# Patient Record
Sex: Female | Born: 1990 | Race: White | Hispanic: No | Marital: Single | Smoking: Current some day smoker
Health system: Southern US, Community
[De-identification: ages and names within clinical notes are randomized; demographics above are authoritative.]

## PROBLEM LIST (undated history)

## (undated) DIAGNOSIS — B029 Zoster without complications: Secondary | ICD-10-CM

## (undated) DIAGNOSIS — Z8619 Personal history of other infectious and parasitic diseases: Secondary | ICD-10-CM

## (undated) DIAGNOSIS — S0990XA Unspecified injury of head, initial encounter: Secondary | ICD-10-CM

## (undated) HISTORY — DX: Zoster without complications: B02.9

## (undated) HISTORY — DX: Personal history of other infectious and parasitic diseases: Z86.19

## (undated) HISTORY — PX: ADENOIDECTOMY: SUR15

## (undated) HISTORY — DX: Unspecified injury of head, initial encounter: S09.90XA

## (undated) HISTORY — PX: MYRINGOTOMY: SUR874

## (undated) HISTORY — PX: WISDOM TOOTH EXTRACTION: SHX21

---
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
## 2012-01-15 ENCOUNTER — Encounter: Payer: Self-pay | Admitting: Internal Medicine

## 2012-01-15 ENCOUNTER — Ambulatory Visit (INDEPENDENT_AMBULATORY_CARE_PROVIDER_SITE_OTHER): Payer: BC Managed Care – PPO | Admitting: Internal Medicine

## 2012-01-15 VITALS — BP 102/72 | HR 79 | Temp 98.3°F | Ht 63.5 in | Wt 124.0 lb

## 2012-01-15 DIAGNOSIS — Z8709 Personal history of other diseases of the respiratory system: Secondary | ICD-10-CM | POA: Insufficient documentation

## 2012-01-15 DIAGNOSIS — J351 Hypertrophy of tonsils: Secondary | ICD-10-CM

## 2012-01-15 DIAGNOSIS — G4710 Hypersomnia, unspecified: Secondary | ICD-10-CM

## 2012-01-15 DIAGNOSIS — Z0000 Encounter for general adult medical examination without abnormal findings: Secondary | ICD-10-CM

## 2012-01-15 DIAGNOSIS — N946 Dysmenorrhea, unspecified: Secondary | ICD-10-CM

## 2012-01-15 DIAGNOSIS — F410 Panic disorder [episodic paroxysmal anxiety] without agoraphobia: Secondary | ICD-10-CM

## 2012-01-15 DIAGNOSIS — N920 Excessive and frequent menstruation with regular cycle: Secondary | ICD-10-CM

## 2012-01-15 LAB — CBC WITH DIFFERENTIAL/PLATELET
Basophils Absolute: 0 10*3/uL (ref 0.0–0.1)
Basophils Relative: 0.4 % (ref 0.0–3.0)
Eosinophils Absolute: 0.1 10*3/uL (ref 0.0–0.7)
HCT: 39.2 % (ref 36.0–46.0)
Hemoglobin: 13.2 g/dL (ref 12.0–15.0)
MCHC: 33.8 g/dL (ref 30.0–36.0)
MCV: 93.1 fl (ref 78.0–100.0)
Monocytes Absolute: 0.4 10*3/uL (ref 0.1–1.0)
Neutro Abs: 2.4 10*3/uL (ref 1.4–7.7)
Platelets: 166 10*3/uL (ref 150.0–400.0)

## 2012-01-15 LAB — LIPID PANEL
HDL: 72 mg/dL (ref >39.00)
LDL Cholesterol: 50 mg/dL (ref 0–99)

## 2012-01-15 LAB — HEPATIC FUNCTION PANEL
ALT: 14 U/L (ref 0–35)
Bilirubin, Direct: 0 mg/dL (ref 0.0–0.3)
Total Bilirubin: 0.7 mg/dL (ref 0.3–1.2)

## 2012-01-15 LAB — BASIC METABOLIC PANEL
CO2: 25 mEq/L (ref 19–32)
Calcium: 9.2 mg/dL (ref 8.4–10.5)
Chloride: 105 mEq/L (ref 96–112)

## 2012-01-15 LAB — T4, FREE: Free T4: 0.76 ng/dL (ref 0.60–1.60)

## 2012-01-15 LAB — TSH: TSH: 0.88 u[IU]/mL (ref 0.35–5.50)

## 2012-01-15 NOTE — Progress Notes (Signed)
Subjective:    Patient ID: Kathleen Hayes, female    DOB: 1990/11/25, 21 y.o.   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 Sees Dr Marlyne Beards about every 6 months. Recently on OCPS for cramps and dysmenorrhea  Recent formulation for 2 months.  To move in sorority house next week. Has had 3 episodes of throat infection seen at [facility] this year and got better . No labs done . Sleeping a lot  Since home from school but working 20 - 30 hours  Summer school  Bedtime 11 arise 6- 7 depending.   Review of Systems ROS:  GEN/ HEENT: No fever, significant weight changes sweats headaches vision problems hearing changes, CV/ PULM; No chest pain shortness of breath cough, syncope,edema  change in exercise tolerance. GI /GU: No adominal pain, vomiting, change in bowel habits. No blood in the stool. SKIN/HEME: ,no acute skin rashes suspicious lesions or bleeding. No lymphadenopathy, nodules, masses.  NEURO/ PSYCH:  No neurologic signs such as weakness numbness. No depression; anxiety.seems to be controlled  IMM/ Allergy: No unusual infections.   REST of 12 system review negative except as per HPI  Outpatient Encounter Prescriptions as of 01/15/2012  Medication Sig Dispense Refill  . ALPRAZolam (XANAX) 0.5 MG tablet 0.5 mg.       . cetirizine (ZYRTEC) 10 MG tablet Take 10 mg by mouth daily.      . citalopram (CELEXA) 20 MG tablet Take 20 mg by mouth daily.        . norgestimate-ethinyl estradiol (ORTHO-CYCLEN,SPRINTEC,PREVIFEM) 0.25-35 MG-MCG tablet Take 1 tablet by mouth daily.  1 Package  11  . DISCONTD: clindamycin-benzoyl peroxide (BENZACLIN WITH PUMP) gel       . DISCONTD: Mefenamic Acid 250 MG CAPS Take 1 capsule (250 mg total) by mouth 4 (four) times daily as needed.  28 each  11       Objective:   Physical Exam BP 102/72  Pulse 79   Temp 98.3 F (36.8 C) (Oral)  Ht 5' 3.5" (1.613 m)  Wt 124 lb (56.246 kg)  BMI 21.62 kg/m2 Wt Readings from Last 3 Encounters:  01/15/12 124 lb (56.246 kg) (43.48%*)  09/11/11 126 lb (57.153 kg) (49.02%*)  01/14/11 109 lb (49.442 kg) (16.96%*)   * Growth percentiles are based on CDC 2-20 Years data.   Ht Readings from Last 3 Encounters:  01/15/12 5' 3.5" (1.613 m) (37.91%*)  09/11/11 5' 3.5" (1.613 m) (38.10%*)  01/14/11 5\' 4"  (1.626 m) (46.40%*)   * Growth percentiles are based on CDC 2-20 Years data.   Body mass index is 21.62 kg/(m^2). @BMIFA @ 43.48%ile based on CDC 2-20 Years weight-for-age data. 37.91%ile based on CDC 2-20 Years stature-for-age data.  Physical Exam: Vital signs reviewed GNF:AOZH is a well-developed well-nourished alert cooperative  white female who appears her stated age in no acute distress.  HEENT: normocephalic atraumatic , Eyes: PERRL EOM's full, conjunctiva clear, Nares: paten,t no deformity discharge or tenderness., Ears: no deformity EAC's clear TMs with normal landmarks. Mouth: OP, no lesions, edema tonsil left 2+ right 1 + no exudate  .  Moist mucous membranes. Dentition in adequate repair. NECK: supple without masses, thyromegaly or bruits. CHEST/PULM:  Clear to auscultation and percussion breath sounds equal no wheeze , rales or rhonchi. No chest wall deformities or tenderness. Breast: normal by  inspection . No dimpling, discharge, masses, tenderness or discharge . CV: PMI is nondisplaced, S1 S2 no gallops, murmurs, rubs. Peripheral pulses are full without delay.No JVD .  ABDOMEN: Bowel sounds normal nontender  No guard or rebound, no hepato splenomegal no CVA tenderness.  No hernia. Extremtities:  No clubbing cyanosis or edema, no acute joint swelling or redness no focal atrophy NEURO:  Oriented x3, cranial nerves 3-12 appear to be intact, no obvious focal weakness,gait within normal limits no abnormal reflexes or asymmetrical SKIN: No acute  rashes normal turgor, color, no bruising or petechiae. PSYCH: Oriented, good eye contact, no obvious depression anxiety, cognition and judgment appear normal. LN: no cervical axillary inguinal adenopathy     Assessment & Plan:  Preventive Health Care Counseled regarding healthy nutrition, exercise, sleep, injury prevention, calcium vit d and healthy weight . utd on immuniz  By hx  ( not in ehr)  Doing better good weight  Reviewed with pt.  Anxiety  Stable on meds per psych Dysmenorrhea  under rx ocps. Per dr Lily Peer  Fatigue: excess sleepiness   Poss deprivation r/o mono after hx of tonsil infection this semester but exam looks good and should improve.  Counseled. Fu if alarm features or  persistent or progressive looks much more relaxed and healthy today.

## 2012-01-15 NOTE — Patient Instructions (Signed)
Your exam is normal except for somewhat enlarged tonsils. Continue lifestyle intervention healthy eating and exercise . Sleep deficit and irreg schedule can add to sleepiness. Weight is just right.  Will notify you  of labs when available. Checking for anemia and poss that you had mono and are recovering.

## 2012-01-16 LAB — EPSTEIN-BARR VIRUS VCA ANTIBODY PANEL
EBV EA IgG: <5 U/mL (ref <9.0)
EBV VCA IgM: <10 U/mL (ref <36.0)

## 2012-11-23 ENCOUNTER — Telehealth: Payer: Self-pay | Admitting: Internal Medicine

## 2012-11-23 NOTE — Telephone Encounter (Signed)
Pt mother called and stated that the daughter needs a CPX prior to 01/29/13. Please assist me in finding a slot.

## 2012-11-23 NOTE — Telephone Encounter (Signed)
See if wp has any Tues or Wed appt available.  Ok to combine 2 appointments.

## 2012-11-26 NOTE — Telephone Encounter (Signed)
lmovm /ga °

## 2012-11-30 ENCOUNTER — Other Ambulatory Visit: Payer: Self-pay | Admitting: Gynecology

## 2012-11-30 ENCOUNTER — Encounter: Payer: BC Managed Care – PPO | Admitting: Internal Medicine

## 2012-12-07 ENCOUNTER — Encounter: Payer: Self-pay | Admitting: Gynecology

## 2012-12-07 ENCOUNTER — Ambulatory Visit (INDEPENDENT_AMBULATORY_CARE_PROVIDER_SITE_OTHER): Payer: BC Managed Care – PPO | Admitting: Gynecology

## 2012-12-07 VITALS — BP 120/74 | Ht 63.5 in | Wt 129.0 lb

## 2012-12-07 DIAGNOSIS — Z113 Encounter for screening for infections with a predominantly sexual mode of transmission: Secondary | ICD-10-CM

## 2012-12-07 DIAGNOSIS — Z01419 Encounter for gynecological examination (general) (routine) without abnormal findings: Secondary | ICD-10-CM

## 2012-12-07 MED ORDER — NORGESTIMATE-ETH ESTRADIOL 0.25-35 MG-MCG PO TABS: 1.0000 | ORAL_TABLET | Freq: Every day | ORAL | Status: DC

## 2012-12-07 NOTE — Progress Notes (Signed)
Kathleen Hayes May 05, 1991 [id number]   History:    22 y.o.  for annual gyn exam with no complaints today. The patient is on Sprintec 28 day oral contraceptive pills for cycle control and for acne. Patient is tolerated well and is having normal menstrual cycle. Patient is currently not sexually active but was active in February of this year.  Past medical history,surgical history, family history and social history were all reviewed and documented in the EPIC chart.  Gynecologic History Patient's last menstrual period was 11/26/2012. Contraception: OCP (estrogen/progesterone) Last Pap: none indicated. Results were: none indicated Last mammogram: none indicated. Results were: none indicated  Obstetric History OB History   Grav Para Term Preterm Abortions TAB SAB Ect Mult Living   0                ROS: A ROS was performed and pertinent positives and negatives are included in the history.  GENERAL: No fevers or chills. HEENT: No change in vision, no earache, sore throat or sinus congestion. NECK: No pain or stiffness. CARDIOVASCULAR: No chest pain or pressure. No palpitations. PULMONARY: No shortness of breath, cough or wheeze. GASTROINTESTINAL: No abdominal pain, nausea, vomiting or diarrhea, melena or bright red blood per rectum. GENITOURINARY: No urinary frequency, urgency, hesitancy or dysuria. MUSCULOSKELETAL: No joint or muscle pain, no back pain, no recent trauma. DERMATOLOGIC: No rash, no itching, no lesions. ENDOCRINE: No polyuria, polydipsia, no heat or cold intolerance. No recent change in weight. HEMATOLOGICAL: No anemia or easy bruising or bleeding. NEUROLOGIC: No headache, seizures, numbness, tingling or weakness. PSYCHIATRIC: No depression, no loss of interest in normal activity or change in sleep pattern.     Exam: chaperone present  BP 120/74  Ht 5' 3.5" (1.613 m)  Wt 129 lb (58.514 kg)  BMI 22.49 kg/m2  LMP 11/26/2012  Body mass index is 22.49 kg/(m^2).  General  appearance : Well developed well nourished female. No acute distress HEENT: Neck supple, trachea midline, no carotid bruits, no thyroidmegaly Lungs: Clear to auscultation, no rhonchi or wheezes, or rib retractions  Heart: Regular rate and rhythm, no murmurs or gallops Breast:Examined in sitting and supine position were symmetrical in appearance, no palpable masses or tenderness,  no skin retraction, no nipple inversion, no nipple discharge, no skin discoloration, no axillary or supraclavicular lymphadenopathy Abdomen: no palpable masses or tenderness, no rebound or guarding Extremities: no edema or skin discoloration or tenderness  Pelvic:  Bartholin, Urethra, Skene Glands: Within normal limits             Vagina: No gross lesions or discharge  Cervix: No gross lesions or discharge  Uterus  anteverted, normal size, shape and consistency, non-tender and mobile  Adnexa  Without masses or tenderness  Anus and perineum  normal   Rectovaginal  normal sphincter tone without palpated masses or tenderness             Hemoccult that indicated   Gardasil Vaccine previously completed  Assessment/Plan:  22 y.o. female for annual exam with no complaints in normal evaluation today. Patient was given prescription refill for Sprintec 28 day oral contraceptive pill. No Pap smear done today the new guidelines discuss. Her first Pap smear will be next year at age 64. GC and chlamydia culture was obtained today. Patient was reminded to do her monthly breast exam.    Ok Edwards MD, 3:38 PM 12/07/2012

## 2012-12-09 ENCOUNTER — Other Ambulatory Visit: Payer: Self-pay | Admitting: Gynecology

## 2012-12-09 MED ORDER — AZITHROMYCIN 500 MG PO TABS: 1000.0000 mg | ORAL_TABLET | Freq: Once | ORAL | Status: DC

## 2012-12-28 ENCOUNTER — Ambulatory Visit (INDEPENDENT_AMBULATORY_CARE_PROVIDER_SITE_OTHER): Payer: BC Managed Care – PPO | Admitting: Gynecology

## 2012-12-28 ENCOUNTER — Encounter: Payer: Self-pay | Admitting: Gynecology

## 2012-12-28 DIAGNOSIS — Z8619 Personal history of other infectious and parasitic diseases: Secondary | ICD-10-CM

## 2012-12-28 DIAGNOSIS — Z113 Encounter for screening for infections with a predominantly sexual mode of transmission: Secondary | ICD-10-CM

## 2012-12-28 HISTORY — DX: Personal history of other infectious and parasitic diseases: Z86.19

## 2012-12-28 NOTE — Progress Notes (Signed)
Patient presented to the office today for a test of cure. She was seen in the office on June 24 her annual exam in the [facility] and chlamydia culture been obtained with a positive result for Chlamydia noted. Patient was prescribed Azithromax 1 g by mouth which she took. She was instructed to notify her partners that he could be treated accordingly. Patient is asymptomatic today.  Wet prep was obtained today which was negative. GC and Chlamydia culture was obtained resulting from this dictation. To complete the full STD screen patient will stop by the lab to be tested for HIV, RPR, hepatitis B and C.

## 2012-12-29 LAB — RPR

## 2012-12-29 LAB — HIV ANTIBODY (ROUTINE TESTING W REFLEX): HIV: NONREACTIVE

## 2013-01-04 ENCOUNTER — Encounter: Payer: Self-pay | Admitting: Internal Medicine

## 2013-01-04 ENCOUNTER — Ambulatory Visit (INDEPENDENT_AMBULATORY_CARE_PROVIDER_SITE_OTHER): Payer: BC Managed Care – PPO | Admitting: Internal Medicine

## 2013-01-04 VITALS — BP 120/72 | HR 97 | Temp 99.0°F | Wt 127.0 lb

## 2013-01-04 DIAGNOSIS — J329 Chronic sinusitis, unspecified: Secondary | ICD-10-CM

## 2013-01-04 DIAGNOSIS — J309 Allergic rhinitis, unspecified: Secondary | ICD-10-CM

## 2013-01-04 MED ORDER — AMOXICILLIN-POT CLAVULANATE 875-125 MG PO TABS: 1.0000 | ORAL_TABLET | Freq: Two times a day (BID) | ORAL | Status: DC

## 2013-01-04 NOTE — Progress Notes (Signed)
Chief Complaint  Patient presents with  . Nasal Congestion    Started on Friday.  Thick white/clear mucus.  Has tried OTC Mucinex and Dayquil  . Cough  . Otalgia  . Sinus pain/pressure  . Headache  . Night Sweats    HPI: Patient comes in today for SDA for  new problem evaluation. Onset about 4-5 days ago like a  Head cold  And now face pain not be relieved .  Zyrtec.   No fever some sweats.  Persistent facial pain and left ear pain. No hxp of signifcant allergies. But has had Flonase in the past.  Home for the day.     From [place] where she lives and goes to school and works. Indications updated no major changes in her health since last seen. ROS: See pertinent positives and negatives per HPI.  Past Medical History  Diagnosis Date  . Acne   . Concussion   . Shingles   . Anxiety     and pa nic; [facility] consult 2011  . History of chlamydia 12/28/2012    Family History  Problem Relation Age of Onset  . Mitral valve prolapse Father   . Hypertension Father   . Allergies Other   . Hypertension Other   . Cancer Maternal Grandfather     PANCREATIC  . Cancer Paternal Grandfather     BRAIN TUMOR  . Hypertension Paternal Grandfather     History   Social History  . Marital Status: Single    Spouse Name: N/A    Number of Children: N/A  . Years of Education: N/A   Social History Main Topics  . Smoking status: Never Smoker   . Smokeless tobacco: Never Used  . Alcohol Use: No  . Drug Use: No  . Sexually Active: No   Other Topics Concern  . 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     Outpatient Encounter Prescriptions as of 01/04/2013  Medication Sig Dispense Refill  . ALPRAZolam (XANAX) 0.5 MG tablet 0.5 mg.       . cetirizine (ZYRTEC) 10 MG tablet  Take 10 mg by mouth daily.      . citalopram (CELEXA) 20 MG tablet Take 20 mg by mouth daily.        . norgestimate-ethinyl estradiol (SPRINTEC 28) 0.25-35 MG-MCG tablet Take 1 tablet by mouth daily.  1 Package  11  . amoxicillin-clavulanate (AUGMENTIN) 875-125 MG per tablet Take 1 tablet by mouth every 12 (twelve) hours. For sinusitis  20 tablet  0  . [DISCONTINUED] azithromycin (ZITHROMAX) 500 MG tablet Take 2 tablets (1,000 mg total) by mouth once.  2 tablet  0   No facility-administered encounter medications on file as of 01/04/2013.    EXAM:  BP 120/72  Pulse 97  Temp(Src) 99 F (37.2 C) (Oral)  Wt 127 lb (57.607 kg)  BMI 22.14 kg/m2  SpO2 98%  LMP 12/13/2012  Body mass index is 22.14 kg/(m^2).   WDWN in NAD  quiet respirations; moderately congested  somewhat hoarse. Non toxic . HEENT: Normocephalic ;atraumatic , Eyes;  PERRL, EOMs  Full, lids and conjunctiva clear,,Ears: no deformities, canals nl, TM landmarks normal,  on the right left slightly distorted but gray clear fluid. Nose: no deformity mucoid dischargecongested;face maxillary area tender Mouth : OP clear without lesion or edema . Tonsils +1 mild erythema no exudate Neck: Supple without adenopathy or masses or bruits Chest:  Clear to A&P without wheezes rales or rhonchi CV:  S1-S2 no gallops or murmurs peripheral perfusion is normal Skin :nl perfusion and no acute rashes   PSYCH: pleasant and cooperative,   ASSESSMENT AND PLAN:  Discussed the following assessment and plan:  Sinusitis - Expectant management although not 10 days severity of symptoms lack of response to decongestants and face pain we'll add antibiotic  ALLERGIC RHINITIS CAUSE UNSPECIFIED - Potential contributor could try over-the-counter nasal cortisone additionally Risk-benefit of antibiotics discussed -Patient advised to return or notify health care team  if symptoms worsen or persist or new concerns arise.  Patient Instructions  Most sinuitis  resolves with decongestants and time  Up to 10 - 14 days however because of severiry of your symptoms it is possible that antibiotic will help resolve sx earlier.    Antibiotic decongestants and can try otc nasal steroid  ( id nasacort ) if you have allergy.  Sinusitis Sinusitis is redness, soreness, and swelling (inflammation) of the paranasal sinuses. Paranasal sinuses are air pockets within the bones of your face (beneath the eyes, the middle of the forehead, or above the eyes). In healthy paranasal sinuses, mucus is able to drain out, and air is able to circulate through them by way of your nose. However, when your paranasal sinuses are inflamed, mucus and air can become trapped. This can allow bacteria and other germs to grow and cause infection. Sinusitis can develop quickly and last only a short time (acute) or continue over a long period (chronic). Sinusitis that lasts for more than 12 weeks is considered chronic.  CAUSES  Causes of sinusitis include:  Allergies.  Structural abnormalities, such as displacement of the cartilage that separates your nostrils (deviated septum), which can decrease the air flow through your nose and sinuses and affect sinus drainage.  Functional abnormalities, such as when the small hairs (cilia) that line your sinuses and help remove mucus do not work properly or are not present. SYMPTOMS  Symptoms of acute and chronic sinusitis are the same. The primary symptoms are pain and pressure around the affected sinuses. Other symptoms include:  Upper toothache.  Earache.  Headache.  Bad breath.  Decreased sense of smell and taste.  A cough, which worsens when you are lying flat.  Fatigue.  Fever.  Thick drainage from your nose, which often is green and may contain pus (purulent).  Swelling and warmth over the affected sinuses. DIAGNOSIS  Your caregiver will perform a physical exam. During the exam, your caregiver may:  Look in your nose for signs  of abnormal growths in your nostrils (nasal polyps).  Tap over the affected sinus to check for signs of infection.  View the inside of your sinuses (endoscopy) with a special imaging device with a light attached (endoscope), which is inserted into your sinuses. If your caregiver suspects that you have chronic sinusitis, one or more of the following tests may be recommended:  Allergy tests.  Nasal culture A sample of mucus is taken from your nose and sent to a lab and screened for bacteria.  Nasal cytology A sample of mucus is taken from your nose and examined by your caregiver to determine if your sinusitis is related to an allergy. TREATMENT  Most  cases of acute sinusitis are related to a viral infection and will resolve on their own within 10 days. Sometimes medicines are prescribed to help relieve symptoms (pain medicine, decongestants, nasal steroid sprays, or saline sprays).  However, for sinusitis related to a bacterial infection, your caregiver will prescribe antibiotic medicines. These are medicines that will help kill the bacteria causing the infection.  Rarely, sinusitis is caused by a fungal infection. In theses cases, your caregiver will prescribe antifungal medicine. For some cases of chronic sinusitis, surgery is needed. Generally, these are cases in which sinusitis recurs more than 3 times per year, despite other treatments. HOME CARE INSTRUCTIONS   Drink plenty of water. Water helps thin the mucus so your sinuses can drain more easily.  Use a humidifier.  Inhale steam 3 to 4 times a day (for example, sit in the bathroom with the shower running).  Apply a warm, moist washcloth to your face 3 to 4 times a day, or as directed by your caregiver.  Use saline nasal sprays to help moisten and clean your sinuses.  Take over-the-counter or prescription medicines for pain, discomfort, or fever only as directed by your caregiver. SEEK IMMEDIATE MEDICAL CARE IF:  You have  increasing pain or severe headaches.  You have nausea, vomiting, or drowsiness.  You have swelling around your face.  You have vision problems.  You have a stiff neck.  You have difficulty breathing. MAKE SURE YOU:   Understand these instructions.  Will watch your condition.  Will get help right away if you are not doing well or get worse. Document Released: 06/02/2005 Document Revised: 08/25/2011 Document Reviewed: 06/17/2011 [facility] Patient Information 2014 [place], [place].     Neta Mends. Dagny Fiorentino M.D.

## 2013-01-04 NOTE — Patient Instructions (Signed)
Most sinuitis resolves with decongestants and time  Up to 10 - 14 days however because of severiry of your symptoms it is possible that antibiotic will help resolve sx earlier.    Antibiotic decongestants and can try otc nasal steroid  ( id nasacort ) if you have allergy.  Sinusitis Sinusitis is redness, soreness, and swelling (inflammation) of the paranasal sinuses. Paranasal sinuses are air pockets within the bones of your face (beneath the eyes, the middle of the forehead, or above the eyes). In healthy paranasal sinuses, mucus is able to drain out, and air is able to circulate through them by way of your nose. However, when your paranasal sinuses are inflamed, mucus and air can become trapped. This can allow bacteria and other germs to grow and cause infection. Sinusitis can develop quickly and last only a short time (acute) or continue over a long period (chronic). Sinusitis that lasts for more than 12 weeks is considered chronic.  CAUSES  Causes of sinusitis include:  Allergies.  Structural abnormalities, such as displacement of the cartilage that separates your nostrils (deviated septum), which can decrease the air flow through your nose and sinuses and affect sinus drainage.  Functional abnormalities, such as when the small hairs (cilia) that line your sinuses and help remove mucus do not work properly or are not present. SYMPTOMS  Symptoms of acute and chronic sinusitis are the same. The primary symptoms are pain and pressure around the affected sinuses. Other symptoms include:  Upper toothache.  Earache.  Headache.  Bad breath.  Decreased sense of smell and taste.  A cough, which worsens when you are lying flat.  Fatigue.  Fever.  Thick drainage from your nose, which often is green and may contain pus (purulent).  Swelling and warmth over the affected sinuses. DIAGNOSIS  Your caregiver will perform a physical exam. During the exam, your caregiver may:  Look in your  nose for signs of abnormal growths in your nostrils (nasal polyps).  Tap over the affected sinus to check for signs of infection.  View the inside of your sinuses (endoscopy) with a special imaging device with a light attached (endoscope), which is inserted into your sinuses. If your caregiver suspects that you have chronic sinusitis, one or more of the following tests may be recommended:  Allergy tests.  Nasal culture A sample of mucus is taken from your nose and sent to a lab and screened for bacteria.  Nasal cytology A sample of mucus is taken from your nose and examined by your caregiver to determine if your sinusitis is related to an allergy. TREATMENT  Most cases of acute sinusitis are related to a viral infection and will resolve on their own within 10 days. Sometimes medicines are prescribed to help relieve symptoms (pain medicine, decongestants, nasal steroid sprays, or saline sprays).  However, for sinusitis related to a bacterial infection, your caregiver will prescribe antibiotic medicines. These are medicines that will help kill the bacteria causing the infection.  Rarely, sinusitis is caused by a fungal infection. In theses cases, your caregiver will prescribe antifungal medicine. For some cases of chronic sinusitis, surgery is needed. Generally, these are cases in which sinusitis recurs more than 3 times per year, despite other treatments. HOME CARE INSTRUCTIONS   Drink plenty of water. Water helps thin the mucus so your sinuses can drain more easily.  Use a humidifier.  Inhale steam 3 to 4 times a day (for example, sit in the bathroom with the shower running).  Apply a warm, moist washcloth to your face 3 to 4 times a day, or as directed by your caregiver.  Use saline nasal sprays to help moisten and clean your sinuses.  Take over-the-counter or prescription medicines for pain, discomfort, or fever only as directed by your caregiver. SEEK IMMEDIATE MEDICAL CARE  IF:  You have increasing pain or severe headaches.  You have nausea, vomiting, or drowsiness.  You have swelling around your face.  You have vision problems.  You have a stiff neck.  You have difficulty breathing. MAKE SURE YOU:   Understand these instructions.  Will watch your condition.  Will get help right away if you are not doing well or get worse. Document Released: 06/02/2005 Document Revised: 08/25/2011 Document Reviewed: 06/17/2011 [facility] Patient Information 2014 [address] [place], [place].

## 2013-01-18 ENCOUNTER — Ambulatory Visit (INDEPENDENT_AMBULATORY_CARE_PROVIDER_SITE_OTHER): Payer: BC Managed Care – PPO | Admitting: Internal Medicine

## 2013-01-18 ENCOUNTER — Encounter: Payer: Self-pay | Admitting: Internal Medicine

## 2013-01-18 ENCOUNTER — Encounter: Payer: BC Managed Care – PPO | Admitting: Internal Medicine

## 2013-01-18 VITALS — BP 96/64 | HR 70 | Temp 98.1°F | Ht 64.0 in | Wt 127.0 lb

## 2013-01-18 DIAGNOSIS — J309 Allergic rhinitis, unspecified: Secondary | ICD-10-CM

## 2013-01-18 DIAGNOSIS — Z0000 Encounter for general adult medical examination without abnormal findings: Secondary | ICD-10-CM

## 2013-01-18 LAB — POCT HEMOGLOBIN: Hemoglobin: 13.5 g/dL (ref 12.2–16.2)

## 2013-01-18 NOTE — Progress Notes (Signed)
Chief Complaint  Patient presents with  . Annual Exam    HPI: Patient comes in today for Preventive Health Care visit    Sinus infection is better still some congestion and ears clogged but no pain .  Periods regular .   On these meds .   Counseling Dr Gerrie Nordmann  Monthly . Or every 2 months.  Mood stable  Junior   Rising.  House   With 5 people .  [facility]  recnet gyne check  rx chl and neg blood work  Reviewed  ROS:  GEN/ HEENT: No fever, significant weight changes sweats headaches vision problems hearing changes, CV/ PULM; No chest pain shortness of breath cough, syncope,edema  change in exercise tolerance. GI /GU: No adominal pain, vomiting, change in bowel habits. No blood in the stool. No significant GU symptoms. SKIN/HEME: ,no acute skin rashes suspicious lesions or bleeding. No lymphadenopathy, nodules, masses.  NEURO/ PSYCH:  No neurologic signs such as weakness numbness. No depression anxiety. IMM/ Allergy: No unusual infections.  Allergy .   REST of 12 system review negative except as per HPI   Past Medical History  Diagnosis Date  . Acne   . Concussion   . Shingles   . Anxiety     and pa nic; [facility] consult 2011  . History of chlamydia 12/28/2012  . HEAD TRAUMA, CLOSED 06/08/2007    Qualifier: Diagnosis of  By: Fabian Sharp MD, Neta Mends     Family History  Problem Relation Age of Onset  . Mitral valve prolapse Father   . Hypertension Father   . Allergies Other   . Hypertension Other   . Cancer Maternal Grandfather     PANCREATIC  . Cancer Paternal Grandfather     BRAIN TUMOR  . Hypertension Paternal Grandfather     History   Social History  . Marital Status: Single    Spouse Name: N/A    Number of Children: N/A  . Years of Education: N/A   Social History Main Topics  . Smoking status: Never Smoker   . Smokeless tobacco: Never Used  . Alcohol Use: No  . Drug Use: No  . Sexually Active: No   Other Topics Concern  . 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  House  4-5 roommates     Outpatient Encounter Prescriptions as of 01/18/2013  Medication Sig Dispense Refill  . ALPRAZolam (XANAX) 0.5 MG tablet 0.5 mg.       . cetirizine (ZYRTEC) 10 MG tablet Take 10 mg by mouth daily.      . citalopram (CELEXA) 20 MG tablet Take 20 mg by mouth daily.        . norgestimate-ethinyl estradiol (SPRINTEC 28) 0.25-35 MG-MCG tablet Take 1 tablet by mouth daily.  1 Package  11  . [DISCONTINUED] amoxicillin-clavulanate (AUGMENTIN) 875-125 MG per tablet Take 1 tablet by mouth every 12 (twelve) hours. For sinusitis  20 tablet  0   No facility-administered encounter medications on file as of 01/18/2013.    EXAM:  BP 96/64  Pulse 70  Temp(Src) 98.1 F (36.7 C) (Oral)  Ht 5\' 4"  (1.626 m)  Wt 127 lb (57.607  kg)  BMI 21.79 kg/m2  SpO2 98%  LMP 01/16/2013  Body mass index is 21.79 kg/(m^2).  Physical Exam: Vital signs reviewed EAV:WUJW is a well-developed well-nourished alert cooperative   female who appears her stated age in no acute distress.  HEENT: normocephalic atraumatic , Eyes: PERRL EOM's full, conjunctiva clear, Nares: paten,t no deformity discharge or tenderness.,mild congestion Ears: no deformity EAC's clear TMs with normal landmarks. Mouth: clear OP, no lesions, edema.  Moist mucous membranes. Dentition in adequate repair. NECK: supple without masses, thyromegaly or bruits. CHEST/PULM:  Clear to auscultation and percussion breath sounds equal no wheeze , rales or rhonchi. No chest wall deformities or tenderness. Breast: normal by inspection . No dimpling, discharge, masses, tenderness or discharge . CV: PMI is nondisplaced, S1 S2 no gallops, murmurs, rubs. Peripheral pulses are full without delay.No JVD .  ABDOMEN: Bowel sounds normal  nontender  No guard or rebound, no hepato splenomegal no CVA tenderness.  No hernia. Extremtities:  No clubbing cyanosis or edema, no acute joint swelling or redness no focal atrophy NEURO:  Oriented x3, cranial nerves 3-12 appear to be intact, no obvious focal weakness,gait within normal limits no abnormal reflexes or asymmetrical SKIN: No acute rashes normal turgor, color, no bruising or petechiae. PSYCH: Oriented, good eye contact, no obvious depression anxiety, cognition and judgment appear normal. LN: no cervical axillary inguinal adenopathy Screening ortho / MS exam: normal;  No scoliosis ,LOM , joint swelling or gait disturbance . Muscle mass is normal .    Lab Results  Component Value Date   WBC 4.6 01/15/2012   HGB 13.5 01/18/2013   HCT 39.2 01/15/2012   PLT 166.0 01/15/2012   GLUCOSE 88 01/15/2012   CHOL 136 01/15/2012   TRIG 72.0 01/15/2012   HDL 72.00 01/15/2012   LDLCALC 50 01/15/2012   ALT 14 01/15/2012   AST 18 01/15/2012   NA 138 01/15/2012   K 4.0 01/15/2012   CL 105 01/15/2012   CREATININE 0.5 01/15/2012   BUN 12 01/15/2012   CO2 25 01/15/2012   TSH 0.88 01/15/2012    ASSESSMENT AND PLAN:  Discussed the following assessment and plan:  Visit for preventive health examination - Plan: POCT hemoglobin  ALLERGIC RHINITIS CAUSE UNSPECIFIED utd on immunizations due tdap next year  Counseled regarding healthy nutrition, exercise, sleep, injury prevention, calcium vit d and healthy weight .  Patient Care Team: Madelin Headings, MD as PCP - General Chauncey Mann, MD (Psychiatry) Wonda Amis, MD (Dermatology) Ok Edwards, MD (Obstetrics and Gynecology) Patient Instructions   Ok to use otc Nasacort for  chronic nasal allergy congestion .  SIGN UP FOR MY CHART Preventive Care for Adults, Female A healthy lifestyle and preventive care can promote health and wellness. Preventive health guidelines for women include the following key practices.  A routine yearly physical is a good way  to check with your caregiver about your health and preventive screening. It is a chance to share any concerns and updates on your health, and to receive a thorough exam.  Visit your dentist for a routine exam and preventive care every 6 months. Brush your teeth twice a day and floss once a day. Good oral hygiene prevents tooth decay and gum disease.  The frequency of eye exams is based on your age, health, family medical history, use of contact lenses, and other factors. Follow your caregiver's recommendations for frequency of eye exams.  Eat a healthy diet. Foods like vegetables, fruits, whole  grains, low-fat dairy products, and lean protein foods contain the nutrients you need without too many calories. Decrease your intake of foods high in solid fats, added sugars, and salt. Eat the right amount of calories for you.Get information about a proper diet from your caregiver, if necessary.  Regular physical exercise is one of the most important things you can do for your health. Most adults should get at least 150 minutes of moderate-intensity exercise (any activity that increases your heart rate and causes you to sweat) each week. In addition, most adults need muscle-strengthening exercises on 2 or more days a week.  Maintain a healthy weight. The body mass index (BMI) is a screening tool to identify possible weight problems. It provides an estimate of body fat based on height and weight. Your caregiver can help determine your BMI, and can help you achieve or maintain a healthy weight.For adults 20 years and older:  A BMI below 18.5 is considered underweight.  A BMI of 18.5 to 24.9 is normal.  A BMI of 25 to 29.9 is considered overweight.  A BMI of 30 and above is considered obese.  Maintain normal blood lipids and cholesterol levels by exercising and minimizing your intake of saturated fat. Eat a balanced diet with plenty of fruit and vegetables. Blood tests for lipids and cholesterol should  begin at age 48 and be repeated every 5 years. If your lipid or cholesterol levels are high, you are over 50, or you are at high risk for heart disease, you may need your cholesterol levels checked more frequently.Ongoing high lipid and cholesterol levels should be treated with medicines if diet and exercise are not effective.  If you smoke, find out from your caregiver how to quit. If you do not use tobacco, do not start.  If you are pregnant, do not drink alcohol. If you are breastfeeding, be very cautious about drinking alcohol. If you are not pregnant and choose to drink alcohol, do not exceed 1 drink per day. One drink is considered to be 12 ounces (355 mL) of beer, 5 ounces (148 mL) of wine, or 1.5 ounces (44 mL) of liquor.  Avoid use of street drugs. Do not share needles with anyone. Ask for help if you need support or instructions about stopping the use of drugs.  High blood pressure causes heart disease and increases the risk of stroke. Your blood pressure should be checked at least every 1 to 2 years. Ongoing high blood pressure should be treated with medicines if weight loss and exercise are not effective.  If you are 53 to 20 years old, ask your caregiver if you should take aspirin to prevent strokes.  Diabetes screening involves taking a blood sample to check your fasting blood sugar level. This should be done once every 3 years, after age 5, if you are within normal weight and without risk factors for diabetes. Testing should be considered at a younger age or be carried out more frequently if you are overweight and have at least 1 risk factor for diabetes.  Breast cancer screening is essential preventive care for women. You should practice "breast self-awareness." This means understanding the normal appearance and feel of your breasts and may include breast self-examination. Any changes detected, no matter how small, should be reported to a caregiver. Women in their 27s and 30s should  have a clinical breast exam (CBE) by a caregiver as part of a regular health exam every 1 to 3 years. After age 33, women  should have a CBE every year. Starting at age 42, women should consider having a mammography (breast X-ray test) every year. Women who have a family history of breast cancer should talk to their caregiver about genetic screening. Women at a high risk of breast cancer should talk to their caregivers about having magnetic resonance imaging (MRI) and a mammography every year.  The Pap test is a screening test for cervical cancer. A Pap test can show cell changes on the cervix that might become cervical cancer if left untreated. A Pap test is a procedure in which cells are obtained and examined from the lower end of the uterus (cervix).  Women should have a Pap test starting at age 31.  Between ages 48 and 55, Pap tests should be repeated every 2 years.  Beginning at age 28, you should have a Pap test every 3 years as long as the past 3 Pap tests have been normal.  Some women have medical problems that increase the chance of getting cervical cancer. Talk to your caregiver about these problems. It is especially important to talk to your caregiver if a new problem develops soon after your last Pap test. In these cases, your caregiver may recommend more frequent screening and Pap tests.  The above recommendations are the same for women who have or have not gotten the vaccine for human papillomavirus (HPV).  If you had a hysterectomy for a problem that was not cancer or a condition that could lead to cancer, then you no longer need Pap tests. Even if you no longer need a Pap test, a regular exam is a good idea to make sure no other problems are starting.  If you are between ages 53 and 51, and you have had normal Pap tests going back 10 years, you no longer need Pap tests. Even if you no longer need a Pap test, a regular exam is a good idea to make sure no other problems are  starting.  If you have had past treatment for cervical cancer or a condition that could lead to cancer, you need Pap tests and screening for cancer for at least 20 years after your treatment.  If Pap tests have been discontinued, risk factors (such as a new sexual partner) need to be reassessed to determine if screening should be resumed.  The HPV test is an additional test that may be used for cervical cancer screening. The HPV test looks for the virus that can cause the cell changes on the cervix. The cells collected during the Pap test can be tested for HPV. The HPV test could be used to screen women aged 58 years and older, and should be used in women of any age who have unclear Pap test results. After the age of 22, women should have HPV testing at the same frequency as a Pap test.  Colorectal cancer can be detected and often prevented. Most routine colorectal cancer screening begins at the age of 29 and continues through age 60. However, your caregiver may recommend screening at an earlier age if you have risk factors for colon cancer. On a yearly basis, your caregiver may provide home test kits to check for hidden blood in the stool. Use of a small camera at the end of a tube, to directly examine the colon (sigmoidoscopy or colonoscopy), can detect the earliest forms of colorectal cancer. Talk to your caregiver about this at age 33, when routine screening begins. Direct examination of the colon should be  repeated every 5 to 10 years through age 32, unless early forms of pre-cancerous polyps or small growths are found.  Hepatitis C blood testing is recommended for all people born from [birth year] through 1965 and any individual with known risks for hepatitis C.  Practice safe sex. Use condoms and avoid high-risk sexual practices to reduce the spread of sexually transmitted infections (STIs). STIs include gonorrhea, chlamydia, syphilis, trichomonas, herpes, HPV, and human immunodeficiency virus (HIV).  Herpes, HIV, and HPV are viral illnesses that have no cure. They can result in disability, cancer, and death. Sexually active women aged 14 and younger should be checked for chlamydia. Older women with new or multiple partners should also be tested for chlamydia. Testing for other STIs is recommended if you are sexually active and at increased risk.  Osteoporosis is a disease in which the bones lose minerals and strength with aging. This can result in serious bone fractures. The risk of osteoporosis can be identified using a bone density scan. Women ages 55 and over and women at risk for fractures or osteoporosis should discuss screening with their caregivers. Ask your caregiver whether you should take a calcium supplement or vitamin D to reduce the rate of osteoporosis.  Menopause can be associated with physical symptoms and risks. Hormone replacement therapy is available to decrease symptoms and risks. You should talk to your caregiver about whether hormone replacement therapy is right for you.  Use sunscreen with sun protection factor (SPF) of 30 or more. Apply sunscreen liberally and repeatedly throughout the day. You should seek shade when your shadow is shorter than you. Protect yourself by wearing long sleeves, pants, a wide-brimmed hat, and sunglasses year round, whenever you are outdoors.  Once a month, do a whole body skin exam, using a mirror to look at the skin on your back. Notify your caregiver of new moles, moles that have irregular borders, moles that are larger than a pencil eraser, or moles that have changed in shape or color.  Stay current with required immunizations.  Influenza. You need a dose every fall (or winter). The composition of the flu vaccine changes each year, so being vaccinated once is not enough.  Pneumococcal polysaccharide. You need 1 to 2 doses if you smoke cigarettes or if you have certain chronic medical conditions. You need 1 dose at age 27 (or older) if you have  never been vaccinated.  Tetanus, diphtheria, pertussis (Tdap, Td). Get 1 dose of Tdap vaccine if you are younger than age 42, are over 7 and have contact with an infant, are a Research scientist (physical sciences), are pregnant, or simply want to be protected from whooping cough. After that, you need a Td booster dose every 10 years. Consult your caregiver if you have not had at least 3 tetanus and diphtheria-containing shots sometime in your life or have a deep or dirty wound.  HPV. You need this vaccine if you are a woman age 71 or younger. The vaccine is given in 3 doses over 6 months.  Measles, mumps, rubella (MMR). You need at least 1 dose of MMR if you were born in 1957 or later. You may also need a second dose.  Meningococcal. If you are age 70 to 36 and a first-year college student living in a residence hall, or have one of several medical conditions, you need to get vaccinated against meningococcal disease. You may also need additional booster doses.  Zoster (shingles). If you are age 59 or older, you should get this vaccine.  Varicella (chickenpox). If you have never had chickenpox or you were vaccinated but received only 1 dose, talk to your caregiver to find out if you need this vaccine.  Hepatitis A. You need this vaccine if you have a specific risk factor for hepatitis A virus infection or you simply wish to be protected from this disease. The vaccine is usually given as 2 doses, 6 to 18 months apart.  Hepatitis B. You need this vaccine if you have a specific risk factor for hepatitis B virus infection or you simply wish to be protected from this disease. The vaccine is given in 3 doses, usually over 6 months. Preventive Services / Frequency Ages 56 to 40  Blood pressure check.** / Every 1 to 2 years.  Lipid and cholesterol check.** / Every 5 years beginning at age 58.  Clinical breast exam.** / Every 3 years for women in their 43s and 30s.  Pap test.** / Every 2 years from ages 38 through 7.  Every 3 years starting at age 12 through age 26 or 79 with a history of 3 consecutive normal Pap tests.  HPV screening.** / Every 3 years from ages 89 through ages 69 to 51 with a history of 3 consecutive normal Pap tests.  Hepatitis C blood test.** / For any individual with known risks for hepatitis C.  Skin self-exam. / Monthly.  Influenza immunization.** / Every year.  Pneumococcal polysaccharide immunization.** / 1 to 2 doses if you smoke cigarettes or if you have certain chronic medical conditions.  Tetanus, diphtheria, pertussis (Tdap, Td) immunization. / A one-time dose of Tdap vaccine. After that, you need a Td booster dose every 10 years.  HPV immunization. / 3 doses over 6 months, if you are 75 and younger.  Measles, mumps, rubella (MMR) immunization. / You need at least 1 dose of MMR if you were born in 1957 or later. You may also need a second dose.  Meningococcal immunization. / 1 dose if you are age 34 to 53 and a first-year college student living in a residence hall, or have one of several medical conditions, you need to get vaccinated against meningococcal disease. You may also need additional booster doses.  Varicella immunization.** / Consult your caregiver.  Hepatitis A immunization.** / Consult your caregiver. 2 doses, 6 to 18 months apart.  Hepatitis B immunization.** / Consult your caregiver. 3 doses usually over 6 months. Ages 46 to 68  Blood pressure check.** / Every 1 to 2 years.  Lipid and cholesterol check.** / Every 5 years beginning at age 36.  Clinical breast exam.** / Every year after age 67.  Mammogram.** / Every year beginning at age 40 and continuing for as long as you are in good health. Consult with your caregiver.  Pap test.** / Every 3 years starting at age 88 through age 60 or 23 with a history of 3 consecutive normal Pap tests.  HPV screening.** / Every 3 years from ages 21 through ages 100 to 66 with a history of 3 consecutive normal Pap  tests.  Fecal occult blood test (FOBT) of stool. / Every year beginning at age 87 and continuing until age 61. You may not need to do this test if you get a colonoscopy every 10 years.  Flexible sigmoidoscopy or colonoscopy.** / Every 5 years for a flexible sigmoidoscopy or every 10 years for a colonoscopy beginning at age 11 and continuing until age 50.  Hepatitis C blood test.** / For all people born from  1943 through 1965 and any individual with known risks for hepatitis C.  Skin self-exam. / Monthly.  Influenza immunization.** / Every year.  Pneumococcal polysaccharide immunization.** / 1 to 2 doses if you smoke cigarettes or if you have certain chronic medical conditions.  Tetanus, diphtheria, pertussis (Tdap, Td) immunization.** / A one-time dose of Tdap vaccine. After that, you need a Td booster dose every 10 years.  Measles, mumps, rubella (MMR) immunization. / You need at least 1 dose of MMR if you were born in 1957 or later. You may also need a second dose.  Varicella immunization.** / Consult your caregiver.  Meningococcal immunization.** / Consult your caregiver.  Hepatitis A immunization.** / Consult your caregiver. 2 doses, 6 to 18 months apart.  Hepatitis B immunization.** / Consult your caregiver. 3 doses, usually over 6 months. Ages 63 and over  Blood pressure check.** / Every 1 to 2 years.  Lipid and cholesterol check.** / Every 5 years beginning at age 28.  Clinical breast exam.** / Every year after age 27.  Mammogram.** / Every year beginning at age 57 and continuing for as long as you are in good health. Consult with your caregiver.  Pap test.** / Every 3 years starting at age 31 through age 68 or 94 with a 3 consecutive normal Pap tests. Testing can be stopped between 65 and 70 with 3 consecutive normal Pap tests and no abnormal Pap or HPV tests in the past 10 years.  HPV screening.** / Every 3 years from ages 21 through ages 102 or 52 with a history of 3  consecutive normal Pap tests. Testing can be stopped between 65 and 70 with 3 consecutive normal Pap tests and no abnormal Pap or HPV tests in the past 10 years.  Fecal occult blood test (FOBT) of stool. / Every year beginning at age 32 and continuing until age 52. You may not need to do this test if you get a colonoscopy every 10 years.  Flexible sigmoidoscopy or colonoscopy.** / Every 5 years for a flexible sigmoidoscopy or every 10 years for a colonoscopy beginning at age 43 and continuing until age 5.  Hepatitis C blood test.** / For all people born from [birth year] through 1963 and any individual with known risks for hepatitis C.  Osteoporosis screening.** / A one-time screening for women ages 55 and over and women at risk for fractures or osteoporosis.  Skin self-exam. / Monthly.  Influenza immunization.** / Every year.  Pneumococcal polysaccharide immunization.** / 1 dose at age 41 (or older) if you have never been vaccinated.  Tetanus, diphtheria, pertussis (Tdap, Td) immunization. / A one-time dose of Tdap vaccine if you are over 65 and have contact with an infant, are a Research scientist (physical sciences), or simply want to be protected from whooping cough. After that, you need a Td booster dose every 10 years.  Varicella immunization.** / Consult your caregiver.  Meningococcal immunization.** / Consult your caregiver.  Hepatitis A immunization.** / Consult your caregiver. 2 doses, 6 to 18 months apart.  Hepatitis B immunization.** / Check with your caregiver. 3 doses, usually over 6 months. ** Family history and personal history of risk and conditions may change your caregiver's recommendations. Document Released: 07/29/2001 Document Revised: 08/25/2011 Document Reviewed: 10/28/2010 [facility] Patient Information 2014 [place], [place].      Neta Mends. Panosh M.D.

## 2013-01-18 NOTE — Patient Instructions (Addendum)
Ok to use otc Nasacort for  chronic nasal allergy congestion .  SIGN UP FOR MY CHART Preventive Care for Adults, Female A healthy lifestyle and preventive care can promote health and wellness. Preventive health guidelines for women include the following key practices.  A routine yearly physical is a good way to check with your caregiver about your health and preventive screening. It is a chance to share any concerns and updates on your health, and to receive a thorough exam.  Visit your dentist for a routine exam and preventive care every 6 months. Brush your teeth twice a day and floss once a day. Good oral hygiene prevents tooth decay and gum disease.  The frequency of eye exams is based on your age, health, family medical history, use of contact lenses, and other factors. Follow your caregiver's recommendations for frequency of eye exams.  Eat a healthy diet. Foods like vegetables, fruits, whole grains, low-fat dairy products, and lean protein foods contain the nutrients you need without too many calories. Decrease your intake of foods high in solid fats, added sugars, and salt. Eat the right amount of calories for you.Get information about a proper diet from your caregiver, if necessary.  Regular physical exercise is one of the most important things you can do for your health. Most adults should get at least 150 minutes of moderate-intensity exercise (any activity that increases your heart rate and causes you to sweat) each week. In addition, most adults need muscle-strengthening exercises on 2 or more days a week.  Maintain a healthy weight. The body mass index (BMI) is a screening tool to identify possible weight problems. It provides an estimate of body fat based on height and weight. Your caregiver can help determine your BMI, and can help you achieve or maintain a healthy weight.For adults 20 years and older:  A BMI below 18.5 is considered underweight.  A BMI of 18.5 to 24.9 is  normal.  A BMI of 25 to 29.9 is considered overweight.  A BMI of 30 and above is considered obese.  Maintain normal blood lipids and cholesterol levels by exercising and minimizing your intake of saturated fat. Eat a balanced diet with plenty of fruit and vegetables. Blood tests for lipids and cholesterol should begin at age 9 and be repeated every 5 years. If your lipid or cholesterol levels are high, you are over 50, or you are at high risk for heart disease, you may need your cholesterol levels checked more frequently.Ongoing high lipid and cholesterol levels should be treated with medicines if diet and exercise are not effective.  If you smoke, find out from your caregiver how to quit. If you do not use tobacco, do not start.  If you are pregnant, do not drink alcohol. If you are breastfeeding, be very cautious about drinking alcohol. If you are not pregnant and choose to drink alcohol, do not exceed 1 drink per day. One drink is considered to be 12 ounces (355 mL) of beer, 5 ounces (148 mL) of wine, or 1.5 ounces (44 mL) of liquor.  Avoid use of street drugs. Do not share needles with anyone. Ask for help if you need support or instructions about stopping the use of drugs.  High blood pressure causes heart disease and increases the risk of stroke. Your blood pressure should be checked at least every 1 to 2 years. Ongoing high blood pressure should be treated with medicines if weight loss and exercise are not effective.  If you are  89 to 20 years old, ask your caregiver if you should take aspirin to prevent strokes.  Diabetes screening involves taking a blood sample to check your fasting blood sugar level. This should be done once every 3 years, after age 49, if you are within normal weight and without risk factors for diabetes. Testing should be considered at a younger age or be carried out more frequently if you are overweight and have at least 1 risk factor for diabetes.  Breast cancer  screening is essential preventive care for women. You should practice "breast self-awareness." This means understanding the normal appearance and feel of your breasts and may include breast self-examination. Any changes detected, no matter how small, should be reported to a caregiver. Women in their 18s and 30s should have a clinical breast exam (CBE) by a caregiver as part of a regular health exam every 1 to 3 years. After age 56, women should have a CBE every year. Starting at age 53, women should consider having a mammography (breast X-ray test) every year. Women who have a family history of breast cancer should talk to their caregiver about genetic screening. Women at a high risk of breast cancer should talk to their caregivers about having magnetic resonance imaging (MRI) and a mammography every year.  The Pap test is a screening test for cervical cancer. A Pap test can show cell changes on the cervix that might become cervical cancer if left untreated. A Pap test is a procedure in which cells are obtained and examined from the lower end of the uterus (cervix).  Women should have a Pap test starting at age 9.  Between ages 33 and 73, Pap tests should be repeated every 2 years.  Beginning at age 62, you should have a Pap test every 3 years as long as the past 3 Pap tests have been normal.  Some women have medical problems that increase the chance of getting cervical cancer. Talk to your caregiver about these problems. It is especially important to talk to your caregiver if a new problem develops soon after your last Pap test. In these cases, your caregiver may recommend more frequent screening and Pap tests.  The above recommendations are the same for women who have or have not gotten the vaccine for human papillomavirus (HPV).  If you had a hysterectomy for a problem that was not cancer or a condition that could lead to cancer, then you no longer need Pap tests. Even if you no longer need a Pap  test, a regular exam is a good idea to make sure no other problems are starting.  If you are between ages 55 and 13, and you have had normal Pap tests going back 10 years, you no longer need Pap tests. Even if you no longer need a Pap test, a regular exam is a good idea to make sure no other problems are starting.  If you have had past treatment for cervical cancer or a condition that could lead to cancer, you need Pap tests and screening for cancer for at least 20 years after your treatment.  If Pap tests have been discontinued, risk factors (such as a new sexual partner) need to be reassessed to determine if screening should be resumed.  The HPV test is an additional test that may be used for cervical cancer screening. The HPV test looks for the virus that can cause the cell changes on the cervix. The cells collected during the Pap test can be tested  for HPV. The HPV test could be used to screen women aged 60 years and older, and should be used in women of any age who have unclear Pap test results. After the age of 9, women should have HPV testing at the same frequency as a Pap test.  Colorectal cancer can be detected and often prevented. Most routine colorectal cancer screening begins at the age of 74 and continues through age 10. However, your caregiver may recommend screening at an earlier age if you have risk factors for colon cancer. On a yearly basis, your caregiver may provide home test kits to check for hidden blood in the stool. Use of a small camera at the end of a tube, to directly examine the colon (sigmoidoscopy or colonoscopy), can detect the earliest forms of colorectal cancer. Talk to your caregiver about this at age 24, when routine screening begins. Direct examination of the colon should be repeated every 5 to 10 years through age 37, unless early forms of pre-cancerous polyps or small growths are found.  Hepatitis C blood testing is recommended for all people born from [birth year] through  1965 and any individual with known risks for hepatitis C.  Practice safe sex. Use condoms and avoid high-risk sexual practices to reduce the spread of sexually transmitted infections (STIs). STIs include gonorrhea, chlamydia, syphilis, trichomonas, herpes, HPV, and human immunodeficiency virus (HIV). Herpes, HIV, and HPV are viral illnesses that have no cure. They can result in disability, cancer, and death. Sexually active women aged 1 and younger should be checked for chlamydia. Older women with new or multiple partners should also be tested for chlamydia. Testing for other STIs is recommended if you are sexually active and at increased risk.  Osteoporosis is a disease in which the bones lose minerals and strength with aging. This can result in serious bone fractures. The risk of osteoporosis can be identified using a bone density scan. Women ages 66 and over and women at risk for fractures or osteoporosis should discuss screening with their caregivers. Ask your caregiver whether you should take a calcium supplement or vitamin D to reduce the rate of osteoporosis.  Menopause can be associated with physical symptoms and risks. Hormone replacement therapy is available to decrease symptoms and risks. You should talk to your caregiver about whether hormone replacement therapy is right for you.  Use sunscreen with sun protection factor (SPF) of 30 or more. Apply sunscreen liberally and repeatedly throughout the day. You should seek shade when your shadow is shorter than you. Protect yourself by wearing long sleeves, pants, a wide-brimmed hat, and sunglasses year round, whenever you are outdoors.  Once a month, do a whole body skin exam, using a mirror to look at the skin on your back. Notify your caregiver of new moles, moles that have irregular borders, moles that are larger than a pencil eraser, or moles that have changed in shape or color.  Stay current with required immunizations.  Influenza. You  need a dose every fall (or winter). The composition of the flu vaccine changes each year, so being vaccinated once is not enough.  Pneumococcal polysaccharide. You need 1 to 2 doses if you smoke cigarettes or if you have certain chronic medical conditions. You need 1 dose at age 41 (or older) if you have never been vaccinated.  Tetanus, diphtheria, pertussis (Tdap, Td). Get 1 dose of Tdap vaccine if you are younger than age 88, are over 15 and have contact with an infant, are a healthcare  worker, are pregnant, or simply want to be protected from whooping cough. After that, you need a Td booster dose every 10 years. Consult your caregiver if you have not had at least 3 tetanus and diphtheria-containing shots sometime in your life or have a deep or dirty wound.  HPV. You need this vaccine if you are a woman age 57 or younger. The vaccine is given in 3 doses over 6 months.  Measles, mumps, rubella (MMR). You need at least 1 dose of MMR if you were born in 1957 or later. You may also need a second dose.  Meningococcal. If you are age 35 to 29 and a first-year college student living in a residence hall, or have one of several medical conditions, you need to get vaccinated against meningococcal disease. You may also need additional booster doses.  Zoster (shingles). If you are age 44 or older, you should get this vaccine.  Varicella (chickenpox). If you have never had chickenpox or you were vaccinated but received only 1 dose, talk to your caregiver to find out if you need this vaccine.  Hepatitis A. You need this vaccine if you have a specific risk factor for hepatitis A virus infection or you simply wish to be protected from this disease. The vaccine is usually given as 2 doses, 6 to 18 months apart.  Hepatitis B. You need this vaccine if you have a specific risk factor for hepatitis B virus infection or you simply wish to be protected from this disease. The vaccine is given in 3 doses, usually over 6  months. Preventive Services / Frequency Ages 40 to 38  Blood pressure check.** / Every 1 to 2 years.  Lipid and cholesterol check.** / Every 5 years beginning at age 60.  Clinical breast exam.** / Every 3 years for women in their 56s and 30s.  Pap test.** / Every 2 years from ages 74 through 69. Every 3 years starting at age 60 through age 42 or 47 with a history of 3 consecutive normal Pap tests.  HPV screening.** / Every 3 years from ages 35 through ages 54 to 58 with a history of 3 consecutive normal Pap tests.  Hepatitis C blood test.** / For any individual with known risks for hepatitis C.  Skin self-exam. / Monthly.  Influenza immunization.** / Every year.  Pneumococcal polysaccharide immunization.** / 1 to 2 doses if you smoke cigarettes or if you have certain chronic medical conditions.  Tetanus, diphtheria, pertussis (Tdap, Td) immunization. / A one-time dose of Tdap vaccine. After that, you need a Td booster dose every 10 years.  HPV immunization. / 3 doses over 6 months, if you are 66 and younger.  Measles, mumps, rubella (MMR) immunization. / You need at least 1 dose of MMR if you were born in 1957 or later. You may also need a second dose.  Meningococcal immunization. / 1 dose if you are age 29 to 30 and a first-year college student living in a residence hall, or have one of several medical conditions, you need to get vaccinated against meningococcal disease. You may also need additional booster doses.  Varicella immunization.** / Consult your caregiver.  Hepatitis A immunization.** / Consult your caregiver. 2 doses, 6 to 18 months apart.  Hepatitis B immunization.** / Consult your caregiver. 3 doses usually over 6 months. Ages 67 to 42  Blood pressure check.** / Every 1 to 2 years.  Lipid and cholesterol check.** / Every 5 years beginning at age 21.  Clinical breast exam.** / Every year after age 63.  Mammogram.** / Every year beginning at age 66 and  continuing for as long as you are in good health. Consult with your caregiver.  Pap test.** / Every 3 years starting at age 60 through age 51 or 67 with a history of 3 consecutive normal Pap tests.  HPV screening.** / Every 3 years from ages 93 through ages 53 to 26 with a history of 3 consecutive normal Pap tests.  Fecal occult blood test (FOBT) of stool. / Every year beginning at age 93 and continuing until age 25. You may not need to do this test if you get a colonoscopy every 10 years.  Flexible sigmoidoscopy or colonoscopy.** / Every 5 years for a flexible sigmoidoscopy or every 10 years for a colonoscopy beginning at age 25 and continuing until age 99.  Hepatitis C blood test.** / For all people born from [birth year] through 1963 and any individual with known risks for hepatitis C.  Skin self-exam. / Monthly.  Influenza immunization.** / Every year.  Pneumococcal polysaccharide immunization.** / 1 to 2 doses if you smoke cigarettes or if you have certain chronic medical conditions.  Tetanus, diphtheria, pertussis (Tdap, Td) immunization.** / A one-time dose of Tdap vaccine. After that, you need a Td booster dose every 10 years.  Measles, mumps, rubella (MMR) immunization. / You need at least 1 dose of MMR if you were born in 1957 or later. You may also need a second dose.  Varicella immunization.** / Consult your caregiver.  Meningococcal immunization.** / Consult your caregiver.  Hepatitis A immunization.** / Consult your caregiver. 2 doses, 6 to 18 months apart.  Hepatitis B immunization.** / Consult your caregiver. 3 doses, usually over 6 months. Ages 19 and over  Blood pressure check.** / Every 1 to 2 years.  Lipid and cholesterol check.** / Every 5 years beginning at age 36.  Clinical breast exam.** / Every year after age 37.  Mammogram.** / Every year beginning at age 38 and continuing for as long as you are in good health. Consult with your caregiver.  Pap test.** / Every  3 years starting at age 4 through age 17 or 65 with a 3 consecutive normal Pap tests. Testing can be stopped between 65 and 70 with 3 consecutive normal Pap tests and no abnormal Pap or HPV tests in the past 10 years.  HPV screening.** / Every 3 years from ages 69 through ages 32 or 63 with a history of 3 consecutive normal Pap tests. Testing can be stopped between 65 and 70 with 3 consecutive normal Pap tests and no abnormal Pap or HPV tests in the past 10 years.  Fecal occult blood test (FOBT) of stool. / Every year beginning at age 101 and continuing until age 71. You may not need to do this test if you get a colonoscopy every 10 years.  Flexible sigmoidoscopy or colonoscopy.** / Every 5 years for a flexible sigmoidoscopy or every 10 years for a colonoscopy beginning at age 78 and continuing until age 69.  Hepatitis C blood test.** / For all people born from [birth year] through 1965 and any individual with known risks for hepatitis C.  Osteoporosis screening.** / A one-time screening for women ages 9 and over and women at risk for fractures or osteoporosis.  Skin self-exam. / Monthly.  Influenza immunization.** / Every year.  Pneumococcal polysaccharide immunization.** / 1 dose at age 14 (or older) if you have never been vaccinated.  Tetanus, diphtheria, pertussis (Tdap, Td) immunization. / A one-time dose of Tdap vaccine if you are over 65 and have contact with an infant, are a Research scientist (physical sciences), or simply want to be protected from whooping cough. After that, you need a Td booster dose every 10 years.  Varicella immunization.** / Consult your caregiver.  Meningococcal immunization.** / Consult your caregiver.  Hepatitis A immunization.** / Consult your caregiver. 2 doses, 6 to 18 months apart.  Hepatitis B immunization.** / Check with your caregiver. 3 doses, usually over 6 months. ** Family history and personal history of risk and conditions may change your caregiver's  recommendations. Document Released: 07/29/2001 Document Revised: 08/25/2011 Document Reviewed: 10/28/2010 [facility] Patient Information 2014 [place], [place].

## 2013-04-12 ENCOUNTER — Ambulatory Visit (INDEPENDENT_AMBULATORY_CARE_PROVIDER_SITE_OTHER): Payer: BC Managed Care – PPO | Admitting: Gynecology

## 2013-04-12 ENCOUNTER — Encounter: Payer: Self-pay | Admitting: Gynecology

## 2013-04-12 ENCOUNTER — Ambulatory Visit (INDEPENDENT_AMBULATORY_CARE_PROVIDER_SITE_OTHER): Payer: BC Managed Care – PPO

## 2013-04-12 VITALS — BP 110/70

## 2013-04-12 DIAGNOSIS — N946 Dysmenorrhea, unspecified: Secondary | ICD-10-CM

## 2013-04-12 DIAGNOSIS — N921 Excessive and frequent menstruation with irregular cycle: Secondary | ICD-10-CM

## 2013-04-12 LAB — PREGNANCY, URINE: Preg Test, Ur: NEGATIVE

## 2013-04-12 MED ORDER — ESTRADIOL 2 MG PO TABS: ORAL_TABLET | ORAL | Status: DC

## 2013-04-12 NOTE — Progress Notes (Signed)
The patient presented to the office today stating that for the past month she has been spotting at least twice a day. She has been compliant on oral contraceptive pill. Patient had been on Sprintec 28 day oral contraceptive pill. Patient was seen in the office July 15 for a test of cure since at the time of her annual exam on June 24 she had a positive Chlamydia culture and was treated with Zithromax 1 g by mouth. She returned for full STD workup consisting of repeat GC and chlamydia culture along with HIV, RPR, hepatitis B and C, and wet prep which all were negative.  Patient had a negative urine pregnancy test in the office today  . Exam: Bartholin's urethra Skene was within normal limits Vagina: Menstrual blood present but no lesions seen Cervix: No active bleeding no lesions seen Uterus: Anteverted normal size shape and consistency Adnexa: No palpable mass or tenderness Rectal exam: Not done  Ultrasound: Uterus measures 7.0 x 4.4 x 3.1 cm with an endometrial stripe 1.3 mm. Endometrium thin echogenic avascular. Right ovary left over normal. No fluid in the cul-de-sac. No apparent masses seen on either adnexa. Small follicles were noted bilateral measure less than 4 mm.  Assessment/plan: Breakthrough bleeding on Sprintec 28 day oral contraceptive pill. Patient has been on medication for over a year with good compliance and no problems still recent. Patient states she has been under a lot of stress recently. Several months ago she was treated for chlamydia infection in followup culture and STD screen were all negative. Patient has not been sexually active since then. We discussed one of 2 options: Change to Nuva Ring in an effort to maintain a steady hormonal state in comparison to the oral contraceptive pill or stay with her current oral contraceptive pill and add Estrace 2 mg one by mouth daily for 7 days to stabilize the endometrium. Patient decided to proceed with the latter.

## 2013-11-13 ENCOUNTER — Other Ambulatory Visit: Payer: Self-pay | Admitting: Gynecology

## 2013-11-14 ENCOUNTER — Other Ambulatory Visit: Payer: Self-pay

## 2013-11-14 MED ORDER — NORGESTIMATE-ETH ESTRADIOL 0.25-35 MG-MCG PO TABS: ORAL_TABLET | ORAL | Status: DC

## 2013-12-15 ENCOUNTER — Other Ambulatory Visit: Payer: Self-pay | Admitting: Gynecology

## 2013-12-24 ENCOUNTER — Other Ambulatory Visit: Payer: Self-pay | Admitting: Gynecology

## 2013-12-28 ENCOUNTER — Telehealth: Payer: Self-pay | Admitting: Internal Medicine

## 2013-12-28 NOTE — Telephone Encounter (Signed)
Pt mom is requesting cpx before 01-27-14 and blood work prior. Can I create 30 min slot?

## 2013-12-28 NOTE — Telephone Encounter (Signed)
yes

## 2013-12-29 NOTE — Telephone Encounter (Signed)
Pt has been sch for cpx on 01-19-14 and blood work on 01-12-14

## 2013-12-29 NOTE — Telephone Encounter (Signed)
Yes agin

## 2014-01-12 ENCOUNTER — Encounter: Payer: Self-pay | Admitting: Family Medicine

## 2014-01-12 ENCOUNTER — Other Ambulatory Visit (INDEPENDENT_AMBULATORY_CARE_PROVIDER_SITE_OTHER): Payer: BC Managed Care – PPO

## 2014-01-12 ENCOUNTER — Ambulatory Visit (INDEPENDENT_AMBULATORY_CARE_PROVIDER_SITE_OTHER): Payer: BC Managed Care – PPO | Admitting: Family Medicine

## 2014-01-12 VITALS — BP 118/70 | HR 68 | Temp 98.2°F | Wt 136.0 lb

## 2014-01-12 DIAGNOSIS — J351 Hypertrophy of tonsils: Secondary | ICD-10-CM

## 2014-01-12 DIAGNOSIS — Z0000 Encounter for general adult medical examination without abnormal findings: Secondary | ICD-10-CM

## 2014-01-12 LAB — CBC WITH DIFFERENTIAL/PLATELET
BASOS PCT: 0.4 % (ref 0.0–3.0)
Basophils Absolute: 0 10*3/uL (ref 0.0–0.1)
EOS PCT: 0.9 % (ref 0.0–5.0)
Eosinophils Absolute: 0.1 10*3/uL (ref 0.0–0.7)
HEMATOCRIT: 39.4 % (ref 36.0–46.0)
HEMOGLOBIN: 13.3 g/dL (ref 12.0–15.0)
LYMPHS ABS: 1.8 10*3/uL (ref 0.7–4.0)
Lymphocytes Relative: 19.9 % (ref 12.0–46.0)
MCHC: 33.7 g/dL (ref 30.0–36.0)
MCV: 92.3 fl (ref 78.0–100.0)
MONOS PCT: 7.1 % (ref 3.0–12.0)
Monocytes Absolute: 0.6 10*3/uL (ref 0.1–1.0)
NEUTROS ABS: 6.3 10*3/uL (ref 1.4–7.7)
Neutrophils Relative %: 71.7 % (ref 43.0–77.0)
Platelets: 196 10*3/uL (ref 150.0–400.0)
RBC: 4.27 Mil/uL (ref 3.87–5.11)
RDW: 12.5 % (ref 11.5–15.5)
WBC: 8.8 10*3/uL (ref 4.0–10.5)

## 2014-01-12 LAB — LIPID PANEL
Cholesterol: 134 mg/dL (ref 0–200)
HDL: 69 mg/dL (ref >39.00)
LDL Cholesterol: 47 mg/dL (ref 0–99)
NONHDL: 65
Total CHOL/HDL Ratio: 2
Triglycerides: 92 mg/dL (ref 0.0–149.0)
VLDL: 18.4 mg/dL (ref 0.0–40.0)

## 2014-01-12 LAB — BASIC METABOLIC PANEL
BUN: 11 mg/dL (ref 6–23)
CO2: 26 mEq/L (ref 19–32)
Calcium: 9.2 mg/dL (ref 8.4–10.5)
Chloride: 107 mEq/L (ref 96–112)
Creatinine, Ser: 0.6 mg/dL (ref 0.4–1.2)
GFR: 131.08 mL/min (ref >60.00)
Glucose, Bld: 92 mg/dL (ref 70–99)
POTASSIUM: 4.9 meq/L (ref 3.5–5.1)
SODIUM: 139 meq/L (ref 135–145)

## 2014-01-12 LAB — TSH: TSH: 1.22 u[IU]/mL (ref 0.35–4.50)

## 2014-01-12 LAB — HEPATIC FUNCTION PANEL
ALBUMIN: 3.4 g/dL — AB (ref 3.5–5.2)
ALK PHOS: 50 U/L (ref 39–117)
ALT: 12 U/L (ref 0–35)
AST: 17 U/L (ref 0–37)
Bilirubin, Direct: 0.1 mg/dL (ref 0.0–0.3)
TOTAL PROTEIN: 7.2 g/dL (ref 6.0–8.3)
Total Bilirubin: 0.3 mg/dL (ref 0.2–1.2)

## 2014-01-12 NOTE — Assessment & Plan Note (Signed)
Suspect patient with viral tonsillitis. We discussed testing for strep but patient declined (centor 2/5) stating this is not typical for her when she has strep throat. . Treat symptomatically. Has follow up with Dr. Fabian SharpPanosh next week and plans to discuss ENT referral for tonsilectomy. I offered her this referral today but she preferred to discuss with PCP.

## 2014-01-12 NOTE — Progress Notes (Signed)
  Tana ConchStephen Hunter, MD Phone: [phone]  Subjective:   Kathleen GibbonsJennifer Hayes is a 23 y.o. year old very pleasant female patient who presents with the following:  Tonsil Enlargement/L ear pain Patient with sore throat for about a week. Complains of enlarged tonsils that are mildly tender aching. 5 days ago she started having some left ear discomfort though she points to her tonsils and then runs her fingers up to her ears and says that general area hurts. No lymph nodes tender. No cough. No known sick contacts .She states this is the 3rd time in last year this has happened and states this is less frequent then most years. She has had her adenoids removed previously.  ROS- no cough/congestion/nausea/vomiting/chills. No fever.   Past Medical History- history enlarged tonsils and tonsilitis, panic disorder, anxiety, seasonal allergies, acne  Medications- reviewed and updated Current Outpatient Prescriptions  Medication Sig Dispense Refill  . ALPRAZolam (XANAX) 0.5 MG tablet 0.5 mg.       . cetirizine (ZYRTEC) 10 MG tablet Take 10 mg by mouth daily.      . citalopram (CELEXA) 20 MG tablet Take 20 mg by mouth daily.        . SPRINTEC 28 0.25-35 MG-MCG tablet TAKE 1 TABLET BY MOUTH DAILY  28 tablet  0  . zolpidem (AMBIEN CR) 6.25 MG CR tablet Take 6.25 mg by mouth at bedtime as needed for sleep.       No current facility-administered medications for this visit.   Objective: BP 118/70  Pulse 68  Temp(Src) 98.2 F (36.8 C)  Wt 136 lb (61.689 kg) Gen: NAD, resting comfortably HEENT: 3+ tonsils both touching the uvula but not "kissing" each other. No exudate. TM normal bilaterally with exception of some scarring from history tympanostomy tubes. Mucous membranes are moist. Neck: no lymph nodes notes CV: RRR no murmurs rubs or gallops Lungs: CTAB no crackles, wheeze, rhonchi Skin: warm, dry, no rash  Neuro: grossly normal, moves all extremities  Assessment/Plan:  Enlarged tonsils Suspect  patient with viral tonsillitis. We discussed testing for strep but patient declined (centor 2/5) stating this is not typical for her when she has strep throat. . Treat symptomatically. Has follow up with Dr. Fabian SharpPanosh next week and plans to discuss ENT referral for tonsilectomy. I offered her this referral today but she preferred to discuss with PCP.

## 2014-01-12 NOTE — Patient Instructions (Signed)
Suspect viral pharyngitis. I think the ear pain is referred from the throat. Your TM membranes looked fine other than some old scarring. You opted not to have testing for strep today.  We discussed referral to ENT but you decided to talk to Dr. Fabian SharpPanosh about this next week.  From your report that you have 3 episodes at least every year for the last few years, sounds like you would be a candidate for tonsillectomy.  See us back if you develop fevers, worsening pain.

## 2014-01-19 ENCOUNTER — Encounter: Payer: Self-pay | Admitting: Internal Medicine

## 2014-01-19 ENCOUNTER — Ambulatory Visit (INDEPENDENT_AMBULATORY_CARE_PROVIDER_SITE_OTHER): Payer: BC Managed Care – PPO | Admitting: Internal Medicine

## 2014-01-19 VITALS — BP 102/60 | Temp 98.5°F | Ht 64.0 in | Wt 132.0 lb

## 2014-01-19 DIAGNOSIS — Z8709 Personal history of other diseases of the respiratory system: Secondary | ICD-10-CM

## 2014-01-19 DIAGNOSIS — J039 Acute tonsillitis, unspecified: Secondary | ICD-10-CM | POA: Insufficient documentation

## 2014-01-19 DIAGNOSIS — Z0000 Encounter for general adult medical examination without abnormal findings: Secondary | ICD-10-CM

## 2014-01-19 MED ORDER — CEPHALEXIN 500 MG PO CAPS: 500.0000 mg | ORAL_CAPSULE | Freq: Two times a day (BID) | ORAL | Status: DC

## 2014-01-19 NOTE — Progress Notes (Signed)
Pre visit review using our clinic review tool, if applicable. No additional management support is needed unless otherwise documented below in the visit note.  Chief Complaint  Patient presents with  . Annual Exam    HPI: Patient comes in today for Preventive Health Care visit  No major change in health status since last visit . 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   Summer school.  Left  Ear pain and swollen gland. Snores   Sore throat 2 x per year.  Once a month gets subtle pain.usualy left side with tender gland and ear pain.  Health Maintenance  Topic Date Due  . Pap Smear  08/17/2010  . Tetanus/tdap  08/17/2011  . Influenza Vaccine  01/14/2014   Health Maintenance Review   ROS:  GEN/ HEENT: No fever, significant weight changes sweats headaches vision problems hearing changes, CV/ PULM; No chest pain shortness of breath cough, syncope,edema  change in exercise tolerance. GI /GU: No adominal pain, vomiting, change in bowel habits. No blood in the stool. SKIN/HEME: ,no acute skin rashes suspicious lesions or bleeding. No lymphadenopathy, nodules, masses.  NEURO/ PSYCH:  No neurologic signs such as weakness numbness. In couseling  IMM/ Allergy: No unusual infections.  Allergy .   REST of 12 system review negative except as per HPI   Past Medical History  Diagnosis Date  . Acne   . Concussion   . Shingles   . Anxiety     and pa nic; [facility] consult 2011  . History of chlamydia 12/28/2012  . HEAD TRAUMA, CLOSED 06/08/2007    Qualifier: Diagnosis of  By: Fabian Sharp MD, Neta Mends     Family History  Problem Relation Age of Onset  . Mitral valve prolapse Father   . Hypertension Father   . Allergies Other   . Hypertension Other   . Cancer Maternal Grandfather     PANCREATIC  .  Cancer Paternal Grandfather     BRAIN TUMOR  . Hypertension Paternal Grandfather     History   Social History  . Marital Status: Single    Spouse Name: N/A    Number of Children: N/A  . Years of Education: N/A   Social History Main Topics  . Smoking status: Never Smoker   . Smokeless tobacco: Never Used  . Alcohol Use: No  . Drug Use: No  . Sexual Activity: No   Other Topics Concern  . 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  Fine arts.    In apt. 3 house mates.     Outpatient Encounter Prescriptions as of 01/19/2014  Medication Sig  . ALPRAZolam (XANAX) 0.5 MG tablet 0.5 mg.   . cetirizine (ZYRTEC) 10 MG tablet Take 10 mg by mouth daily.  . citalopram (CELEXA) 20 MG tablet Take 20 mg by mouth daily.    . SPRINTEC 28 0.25-35 MG-MCG tablet TAKE 1 TABLET BY MOUTH DAILY  .  zolpidem (AMBIEN CR) 6.25 MG CR tablet Take 6.25 mg by mouth at bedtime as needed for sleep.  . cephALEXin (KEFLEX) 500 MG capsule Take 1 capsule (500 mg total) by mouth 2 (two) times daily.    EXAM:  BP 102/60  Temp(Src) 98.5 F (36.9 C) (Oral)  Ht 5\' 4"  (1.626 m)  Wt 132 lb (59.875 kg)  BMI 22.65 kg/m2  LMP 01/12/2014  Body mass index is 22.65 kg/(m^2).  Physical Exam: Vital signs reviewed ZOX:WRUE is a well-developed well-nourished alert cooperative    who appearsr stated age in no acute distress.  HEENT: normocephalic atraumatic , Eyes: PERRL EOM's full, conjunctiva clear, Nares: paten,t no deformity discharge or tenderness., Ears: no deformity EAC's clear TMs with normal landmarks. Mouth: clear OP, tonsil 2+ r 3+ left redness no exuate  no lesions, edema.  Moist mucous membranes. Dentition in adequate repair. NECK: supple  1+ lef ac node tender  Otherwise without masses, thyromegaly or  bruits. CHEST/PULM:  Clear to auscultation and percussion breath sounds equal no wheeze , rales or rhonchi. No chest wall deformities or tenderness. ,Breast: normal by inspection . No dimpling, discharge, masses, tenderness or discharge . CV: PMI is nondisplaced, S1 S2 no gallops, murmurs, rubs. Peripheral pulses are full without delay.No JVD .  ABDOMEN: Bowel sounds normal nontender  No guard or rebound, no hepato splenomegal no CVA tenderness.   Extremtities:  No clubbing cyanosis or edema, no acute joint swelling or redness no focal atrophy NEURO:  Oriented x3, cranial nerves 3-12 appear to be intact, no obvious focal weakness,gait within normal limits no abnormal reflexes or asymmetrical SKIN: No acute rashes normal turgor, color, no bruising or petechiae. PSYCH: Oriented, good eye contact, no obvious depression anxiety, LN: no axillary inguinal adenopathy  Lab Results  Component Value Date   WBC 8.8 01/12/2014   HGB 13.3 01/12/2014   HCT 39.4 01/12/2014   PLT 196.0 01/12/2014   GLUCOSE 92 01/12/2014   CHOL 134 01/12/2014   TRIG 92.0 01/12/2014   HDL 69.00 01/12/2014   LDLCALC 47 01/12/2014   ALT 12 01/12/2014   AST 17 01/12/2014   NA 139 01/12/2014   K 4.9 01/12/2014   CL 107 01/12/2014   CREATININE 0.6 01/12/2014   BUN 11 01/12/2014   CO2 26 01/12/2014   TSH 1.22 01/12/2014    ASSESSMENT AND PLAN:  Discussed the following assessment and plan:  Encounter for preventive health examination - Blood test reviewed  Hx of tonsillitis  Tonsillitis - left,  acute on chronic iwth adenopathy left. Unilateral left with adenopathy and history of same empiric Keflex has had some side effects with Augmentin in the past. Contact us if persistent progressive consider ENT evaluation for recurrent tonsillitis tonsillar hypertrophy  symptoms. Patient Care Team: Madelin Headings, MD as PCP - General Chauncey Mann, MD (Psychiatry) Arminda Resides, MD (Dermatology) Ok Edwards, MD (Obstetrics and  Gynecology) Patient Instructions   You have unilateral tonsillitis today that we are treating with antibiotic. It is certainly possible you have had a low grade chronic tonsillitis. You might benefit from tonsillectomy let us know if you need an ear nose and throat referral. Laboratory tests done this week were normal. Followup with your specialist about your other medications as discussed.  Continue lifestyle intervention healthy eating and exercise . Healthy lifestyle includes : At least 150 minutes of exercise weeks  , weight at healthy levels, which is usually   BMI 19-25. Avoid trans fats and processed  foods;  Increase fresh fruits and veges to 5 servings per day. And avoid sweet beverages including tea and juice. Mediterranean diet with olive oil and nuts have been noted to be heart and brain healthy . Avoid tobacco products . Limit  alcohol to  7 per week for women and 14 servings for men.  Get adequate sleep .    Neta MendsWanda K. Lorayne Getchell M.D.

## 2014-01-19 NOTE — Patient Instructions (Addendum)
You have unilateral tonsillitis today that we are treating with antibiotic. It is certainly possible you have had a low grade chronic tonsillitis. You might benefit from tonsillectomy let us know if you need an ear nose and throat referral. Laboratory tests done this week were normal. Followup with your specialist about your other medications as discussed.  Continue lifestyle intervention healthy eating and exercise . Healthy lifestyle includes : At least 150 minutes of exercise weeks  , weight at healthy levels, which is usually   BMI 19-25. Avoid trans fats and processed foods;  Increase fresh fruits and veges to 5 servings per day. And avoid sweet beverages including tea and juice. Mediterranean diet with olive oil and nuts have been noted to be heart and brain healthy . Avoid tobacco products . Limit  alcohol to  7 per week for women and 14 servings for men.  Get adequate sleep .

## 2014-01-20 ENCOUNTER — Encounter: Payer: Self-pay | Admitting: Gynecology

## 2014-01-20 ENCOUNTER — Other Ambulatory Visit (HOSPITAL_COMMUNITY)
Admission: RE | Admit: 2014-01-20 | Discharge: 2014-01-20 | Disposition: A | Discharge status: Home / Self Care | Payer: BC Managed Care – PPO | Source: Ambulatory Visit | Attending: Gynecology | Admitting: Gynecology

## 2014-01-20 ENCOUNTER — Ambulatory Visit (INDEPENDENT_AMBULATORY_CARE_PROVIDER_SITE_OTHER): Payer: BC Managed Care – PPO | Admitting: Gynecology

## 2014-01-20 VITALS — BP 112/70 | Ht 63.5 in | Wt 131.0 lb

## 2014-01-20 DIAGNOSIS — Z01419 Encounter for gynecological examination (general) (routine) without abnormal findings: Secondary | ICD-10-CM

## 2014-01-20 DIAGNOSIS — N921 Excessive and frequent menstruation with irregular cycle: Secondary | ICD-10-CM

## 2014-01-20 DIAGNOSIS — Z113 Encounter for screening for infections with a predominantly sexual mode of transmission: Secondary | ICD-10-CM

## 2014-01-20 NOTE — Patient Instructions (Addendum)
Levonorgestrel intrauterine device (IUD) What is this medicine? LEVONORGESTREL IUD (LEE voe nor jes trel) is a contraceptive (birth control) device. The device is placed inside the uterus by a healthcare professional. It is used to prevent pregnancy and can also be used to treat heavy bleeding that occurs during your period. Depending on the device, it can be used for 3 to 5 years. This medicine may be used for other purposes; ask your health care provider or pharmacist if you have questions. COMMON BRAND NAME(S): LILETTA, Mirena, Skyla What should I tell my health care provider before I take this medicine? They need to know if you have any of these conditions: -abnormal Pap smear -cancer of the breast, uterus, or cervix -diabetes -endometritis -genital or pelvic infection now or in the past -have more than one sexual partner or your partner has more than one partner -heart disease -history of an ectopic or tubal pregnancy -immune system problems -IUD in place -liver disease or tumor -problems with blood clots or take blood-thinners -use intravenous drugs -uterus of unusual shape -vaginal bleeding that has not been explained -an unusual or allergic reaction to levonorgestrel, other hormones, silicone, or polyethylene, medicines, foods, dyes, or preservatives -pregnant or trying to get pregnant -breast-feeding How should I use this medicine? This device is placed inside the uterus by a health care professional. Talk to your pediatrician regarding the use of this medicine in children. Special care may be needed. Overdosage: If you think you have taken too much of this medicine contact a poison control center or emergency room at once. NOTE: This medicine is only for you. Do not share this medicine with others. What if I miss a dose? This does not apply. What may interact with this medicine? Do not take this medicine with any of the following  medications: -amprenavir -bosentan -fosamprenavir This medicine may also interact with the following medications: -aprepitant -barbiturate medicines for inducing sleep or treating seizures -bexarotene -griseofulvin -medicines to treat seizures like carbamazepine, ethotoin, felbamate, oxcarbazepine, phenytoin, topiramate -modafinil -pioglitazone -rifabutin -rifampin -rifapentine -some medicines to treat HIV infection like atazanavir, indinavir, lopinavir, nelfinavir, tipranavir, ritonavir -St. John's wort -warfarin This list may not describe all possible interactions. Give your health care provider a list of all the medicines, herbs, non-prescription drugs, or dietary supplements you use. Also tell them if you smoke, drink alcohol, or use illegal drugs. Some items may interact with your medicine. What should I watch for while using this medicine? Visit your doctor or health care professional for regular check ups. See your doctor if you or your partner has sexual contact with others, becomes HIV positive, or gets a sexual transmitted disease. This product does not protect you against HIV infection (AIDS) or other sexually transmitted diseases. You can check the placement of the IUD yourself by reaching up to the top of your vagina with clean fingers to feel the threads. Do not pull on the threads. It is a good habit to check placement after each menstrual period. Call your doctor right away if you feel more of the IUD than just the threads or if you cannot feel the threads at all. The IUD may come out by itself. You may become pregnant if the device comes out. If you notice that the IUD has come out use a backup birth control method like condoms and call your health care provider. Using tampons will not change the position of the IUD and are okay to use during your period. What side effects may   I notice from receiving this medicine? Side effects that you should report to your doctor or  health care professional as soon as possible: -allergic reactions like skin rash, itching or hives, swelling of the face, lips, or tongue -fever, flu-like symptoms -genital sores -high blood pressure -no menstrual period for 6 weeks during use -pain, swelling, warmth in the leg -pelvic pain or tenderness -severe or sudden headache -signs of pregnancy -stomach cramping -sudden shortness of breath -trouble with balance, talking, or walking -unusual vaginal bleeding, discharge -yellowing of the eyes or skin Side effects that usually do not require medical attention (report to your doctor or health care professional if they continue or are bothersome): -acne -breast pain -change in sex drive or performance -changes in weight -cramping, dizziness, or faintness while the device is being inserted -headache -irregular menstrual bleeding within first 3 to 6 months of use -nausea This list may not describe all possible side effects. Call your doctor for medical advice about side effects. You may report side effects to FDA at 1-800-FDA-1088. Where should I keep my medicine? This does not apply. NOTE: This sheet is a summary. It may not cover all possible information. If you have questions about this medicine, talk to your doctor, pharmacist, or health care provider.  2015, Elsevier/Gold Standard. (2011-07-03 13:54:04)  

## 2014-01-20 NOTE — Progress Notes (Signed)
Kathleen Hayes 08/25/1990 [id number]   History:    23 y.o.  for annual gyn exam requesting to discuss alternative form of contraception. Patient was on Sprintec 28 day oral contraceptive pill and prior to that had been on Loestrin. Patient has breakthrough bleeding second half of her pill pack. This is patient's first Pap smear today. She is sexually active. Dr.Panosh is her PCP and did her blood work recently.  Past medical history,surgical history, family history and social history were all reviewed and documented in the EPIC chart.  Gynecologic History Patient's last menstrual period was 01/12/2014. Contraception: OCP (estrogen/progesterone) Last Pap: No prior study. Results were: No prior study Last mammogram: Not indicated. Results were: Not indicated  Obstetric History OB History  Gravida Para Term Preterm AB SAB TAB Ectopic Multiple Living  0                  ROS: A ROS was performed and pertinent positives and negatives are included in the history.  GENERAL: No fevers or chills. HEENT: No change in vision, no earache, sore throat or sinus congestion. NECK: No pain or stiffness. CARDIOVASCULAR: No chest pain or pressure. No palpitations. PULMONARY: No shortness of breath, cough or wheeze. GASTROINTESTINAL: No abdominal pain, nausea, vomiting or diarrhea, melena or bright red blood per rectum. GENITOURINARY: No urinary frequency, urgency, hesitancy or dysuria. MUSCULOSKELETAL: No joint or muscle pain, no back pain, no recent trauma. DERMATOLOGIC: No rash, no itching, no lesions. ENDOCRINE: No polyuria, polydipsia, no heat or cold intolerance. No recent change in weight. HEMATOLOGICAL: No anemia or easy bruising or bleeding. NEUROLOGIC: No headache, seizures, numbness, tingling or weakness. PSYCHIATRIC: No depression, no loss of interest in normal activity or change in sleep pattern.     Exam: chaperone present  BP 112/70  Ht 5' 3.5" (1.613 m)  Wt 131 lb (59.421 kg)  BMI  22.84 kg/m2  LMP 01/12/2014  Body mass index is 22.84 kg/(m^2).  General appearance : Well developed well nourished female. No acute distress HEENT: Neck supple, trachea midline, no carotid bruits, no thyroidmegaly Lungs: Clear to auscultation, no rhonchi or wheezes, or rib retractions  Heart: Regular rate and rhythm, no murmurs or gallops Breast:Examined in sitting and supine position were symmetrical in appearance, no palpable masses or tenderness,  no skin retraction, no nipple inversion, no nipple discharge, no skin discoloration, no axillary or supraclavicular lymphadenopathy Abdomen: no palpable masses or tenderness, no rebound or guarding Extremities: no edema or skin discoloration or tenderness  Pelvic:  Bartholin, Urethra, Skene Glands: Within normal limits             Vagina: No gross lesions or discharge  Cervix: No gross lesions or discharge  Uterus  anteverted, normal size, shape and consistency, non-tender and mobile  Adnexa  Without masses or tenderness  Anus and perineum  normal   Rectovaginal  normal sphincter tone without palpated masses or tenderness             Hemoccult not indicated     Assessment/Plan:  23 y.o. female for annual exam who had her first Pap smear today according to the new guidelines. She was reminded of the importance of monthly breast exam. We discussed alternative forms of contraception she would be an ideal candidate for a Skyla IUD. Literature and information was provided. The risks benefits and pros and cons were discussed. She will return next week to have that placed. We did do also a GC and chlamydia culture today.  Note: This dictation was prepared with  Dragon/digital dictation along withSmart phrase technology. Any transcriptional errors that result from this process are unintentional.   Ok EdwardsFERNANDEZ,JUAN H MD, 10:52 AM 01/20/2014

## 2014-01-20 NOTE — Addendum Note (Signed)
Addended by: Berna SpareASTILLO, BLANCA A on: 01/20/2014 11:01 AM   Modules accepted: Orders

## 2014-01-21 LAB — GC/CHLAMYDIA PROBE AMP
CT Probe RNA: NEGATIVE
GC PROBE AMP APTIMA: NEGATIVE

## 2014-01-23 ENCOUNTER — Telehealth: Payer: Self-pay | Admitting: Gynecology

## 2014-01-23 ENCOUNTER — Other Ambulatory Visit: Payer: Self-pay | Admitting: Gynecology

## 2014-01-23 DIAGNOSIS — Z3049 Encounter for surveillance of other contraceptives: Secondary | ICD-10-CM

## 2014-01-23 LAB — CYTOLOGY - PAP

## 2014-01-23 NOTE — Telephone Encounter (Signed)
01/23/14-Spoke to patient today to let [facility] know [facility] the EqualitySkyla IUD at 100%,no copay. Appt is made.wl

## 2014-01-27 ENCOUNTER — Ambulatory Visit: Payer: BC Managed Care – PPO | Admitting: Gynecology

## 2014-01-28 ENCOUNTER — Other Ambulatory Visit: Payer: Self-pay | Admitting: Gynecology

## 2014-03-29 ENCOUNTER — Other Ambulatory Visit: Payer: BC Managed Care – PPO

## 2014-04-05 ENCOUNTER — Encounter: Payer: BC Managed Care – PPO | Admitting: Internal Medicine

## 2014-12-11 ENCOUNTER — Encounter: Payer: Self-pay | Admitting: Family Medicine

## 2014-12-22 ENCOUNTER — Other Ambulatory Visit: Payer: Self-pay | Admitting: Gynecology

## 2015-01-23 ENCOUNTER — Other Ambulatory Visit: Payer: Self-pay | Admitting: Gynecology

## 2015-02-09 ENCOUNTER — Encounter: Payer: Self-pay | Admitting: Gynecology

## 2015-02-26 ENCOUNTER — Encounter: Payer: Self-pay | Admitting: Gynecology

## 2015-02-26 ENCOUNTER — Ambulatory Visit (INDEPENDENT_AMBULATORY_CARE_PROVIDER_SITE_OTHER): Payer: BLUE CROSS/BLUE SHIELD | Admitting: Gynecology

## 2015-02-26 VITALS — BP 110/60 | Ht 64.0 in | Wt 125.0 lb

## 2015-02-26 DIAGNOSIS — Z01419 Encounter for gynecological examination (general) (routine) without abnormal findings: Secondary | ICD-10-CM | POA: Diagnosis not present

## 2015-02-26 DIAGNOSIS — N643 Galactorrhea not associated with childbirth: Secondary | ICD-10-CM

## 2015-02-26 DIAGNOSIS — Z113 Encounter for screening for infections with a predominantly sexual mode of transmission: Secondary | ICD-10-CM

## 2015-02-26 MED ORDER — NORGESTIMATE-ETH ESTRADIOL 0.25-35 MG-MCG PO TABS: 1.0000 | ORAL_TABLET | Freq: Every day | ORAL | Status: DC

## 2015-02-26 NOTE — Patient Instructions (Signed)
Galactorrhea  Galactorrhea is when there is a milky fluid (discharge) coming from the nipple. It is different from normal milk in nursing mothers. HOME CARE  Avoid touching the breasts during sexual activity.  Perform a breast self exam once a month.  Avoid clothes that rub on your nipples.  Use breasts pads to absorb the fluid.  Wear a support bra. GET HELP RIGHT AWAY IF:  You have galactorrhea and you are trying to get pregnant.  You develop hot flashes, vaginal dryness, or lack of sexual desire.  You stop having menstrual periods, or they are not regular or far apart.  You have headaches.  You have vision problems.  Your breast discharge is bloody or yellowish white (pus-like).  You have breast pain.  You feel a lump in your breast.  Your breast shows wrinkling or dimpling.  Your breast becomes red and puffy (swollen). MAKE SURE YOU:  Understand these instructions.  Will watch your condition.  Will get help right away if you are not doing well or get worse. Document Released: 07/05/2010 Document Revised: 08/25/2011 Document Reviewed: 07/05/2010 ExitCare Patient Information 2015 [employer]. This information is not intended to replace advice given to you by your health care provider. Make sure you discuss any questions you have with your health care provider.  

## 2015-02-26 NOTE — Progress Notes (Signed)
Kathleen Hayes 11-26-1990 [id number]   History:    24 y.o.  for annual GYN exam with the only complains that once in a while when she squeezes her right nipple she sees a light clear fluid. She denies any visual disturbances or any headaches. She's currently on Sprintec 28 day oral contraception pill for contraception. Patient not certain she received the HPV vaccine series given to her by her PCP and will need to check. She is sexually active has had a new partner for a year. She also uses a condom. Patient reports normal menstrual cycles. Last year was her first Pap smear which was normal.  Past medical history,surgical history, family history and social history were all reviewed and documented in the EPIC chart.  Gynecologic History Patient's last menstrual period was 02/09/2015. Contraception: OCP (estrogen/progesterone) Last Pap: 2015. Results were: normal Last mammogram: Not indicated. Results were: Not indicated  Obstetric History OB History  Gravida Para Term Preterm AB SAB TAB Ectopic Multiple Living  0                  ROS: A ROS was performed and pertinent positives and negatives are included in the history.  GENERAL: No fevers or chills. HEENT: No change in vision, no earache, sore throat or sinus congestion. NECK: No pain or stiffness. CARDIOVASCULAR: No chest pain or pressure. No palpitations. PULMONARY: No shortness of breath, cough or wheeze. GASTROINTESTINAL: No abdominal pain, nausea, vomiting or diarrhea, melena or bright red blood per rectum. GENITOURINARY: No urinary frequency, urgency, hesitancy or dysuria. MUSCULOSKELETAL: No joint or muscle pain, no back pain, no recent trauma. DERMATOLOGIC: No rash, no itching, no lesions. ENDOCRINE: No polyuria, polydipsia, no heat or cold intolerance. No recent change in weight. HEMATOLOGICAL: No anemia or easy bruising or bleeding. NEUROLOGIC: No headache, seizures, numbness, tingling or weakness. PSYCHIATRIC: No depression, no  loss of interest in normal activity or change in sleep pattern.     Exam: chaperone present  BP 110/60 mmHg  Ht  (1.626 m)  Wt 125 lb (56.7 kg)  BMI 21.45 kg/m2  LMP 02/09/2015  Body mass index is 21.45 kg/(m^2).  General appearance : Well developed well nourished female. No acute distress HEENT: Eyes: no retinal hemorrhage or exudates,  Neck supple, trachea midline, no carotid bruits, no thyroidmegaly Lungs: Clear to auscultation, no rhonchi or wheezes, or rib retractions  Heart: Regular rate and rhythm, no murmurs or gallops Breast:Examined in sitting and supine position were symmetrical in appearance, no palpable masses or tenderness,  no skin retraction, no nipple inversion, no nipple discharge, no skin discoloration, no axillary or supraclavicular lymphadenopathy Abdomen: no palpable masses or tenderness, no rebound or guarding Extremities: no edema or skin discoloration or tenderness  Pelvic:  Bartholin, Urethra, Skene Glands: Within normal limits             Vagina: No gross lesions or discharge  Cervix: No gross lesions or discharge  Uterus  anteverted, normal size, shape and consistency, non-tender and mobile  Adnexa  Without masses or tenderness  Anus and perineum  normal   Rectovaginal  normal sphincter tone without palpated masses or tenderness             Hemoccult not indicated   GC and Chlamydia culture obtained  Assessment/Plan:  24 y.o. female for annual exam doing well on Sprintec 28 day oral contraception pill. It appears her occasional mild right breast galactorrhea may be physiological in nature. We will check her  prolactin level today. Patient was instructed to examine her breasts once a month. Her breast exam today was normal. She was to check with her PCP to see if she indeed had the HPV vaccine series since she is 24 years of age and would benefit from it. Her Pap smear was not done today. A GC and Chlamydia culture and HIV screening for STD was  performed today.   Ok Edwards MD, 3:29 PM 02/26/2015

## 2015-02-27 LAB — GC/CHLAMYDIA PROBE AMP
CT PROBE, AMP APTIMA: NEGATIVE
GC PROBE AMP APTIMA: NEGATIVE

## 2015-02-27 LAB — HIV ANTIBODY (ROUTINE TESTING W REFLEX): HIV 1&2 Ab, 4th Generation: NONREACTIVE

## 2015-02-27 LAB — PROLACTIN: PROLACTIN: 5 ng/mL

## 2015-02-28 ENCOUNTER — Telehealth: Payer: Self-pay | Admitting: Internal Medicine

## 2015-02-28 NOTE — Telephone Encounter (Signed)
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

## 2015-02-28 NOTE — Telephone Encounter (Signed)
Please make appt when pt can come.  Thanks!

## 2015-03-01 NOTE — Telephone Encounter (Signed)
Pt has been scheduled.  °

## 2015-03-04 ENCOUNTER — Encounter: Payer: Self-pay | Admitting: Gynecology

## 2015-03-05 ENCOUNTER — Telehealth: Payer: Self-pay

## 2015-03-05 NOTE — Telephone Encounter (Signed)
Patient e-mailed wanting to know about Prolactin results  "because the issue is still occurring along with headaches and vision problems".  What to advise regarding prolactin level?

## 2015-03-05 NOTE — Telephone Encounter (Signed)
Please inform her that all her labs were normal. Since she'll continue to have these chronic headaches on a regular basis and would recommend that she be seen by the neurologist. Please correct the name and number of Dr. Vela Prose neurologist for further evaluation.

## 2015-03-05 NOTE — Telephone Encounter (Signed)
Patient informed by My Chart email.

## 2015-03-19 ENCOUNTER — Telehealth: Payer: Self-pay

## 2015-03-19 ENCOUNTER — Ambulatory Visit (INDEPENDENT_AMBULATORY_CARE_PROVIDER_SITE_OTHER): Payer: BLUE CROSS/BLUE SHIELD | Admitting: Internal Medicine

## 2015-03-19 ENCOUNTER — Encounter: Payer: Self-pay | Admitting: Internal Medicine

## 2015-03-19 VITALS — BP 106/62 | Temp 99.1°F | Ht 63.0 in | Wt 121.2 lb

## 2015-03-19 DIAGNOSIS — Z0000 Encounter for general adult medical examination without abnormal findings: Secondary | ICD-10-CM | POA: Diagnosis not present

## 2015-03-19 NOTE — Progress Notes (Signed)
Pre visit review using our clinic review tool, if applicable. No additional management support is needed unless otherwise documented below in the visit note.  Chief Complaint  Patient presents with  . Annual Exam    HPI: Patient  Kathleen Hayes  24 y.o. comes in today for Preventive Health Care visit  Saw  Dr Toney Rakes  Had blood levels  .Marland Kitchen [address] for breast discharge  Doing ok  Lives in [place] home in [place]  1 roommate.  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  Sees behavioral health in [place] :   [place] dx add on  Adder all, rare use of alprazolam  On 40 mg of celexa  And trazadone as needed for sleep. Doing well.   right foot off and on pain; top of foot  Some vibrations  Runs  Has appt with SM.   No other injury.   Health Maintenance  Topic Date Due  . INFLUENZA VACCINE  03/18/2016 (Originally 01/15/2015)  . PAP SMEAR  01/20/2017  . TETANUS/TDAP  12/06/2024  . HIV Screening  Completed   Health Maintenance Review LIFESTYLE:  Exercise:   Running  Has pain top of right foot.  Tobacco/ETS: no Alcohol: not reallu Sugar beverages: not a lot staying a way.  Sleep:  About 7-8 hours   trazadone as needed.   [facility]  And now on adderall .    Taking school days  Or work  Administrator, Civil Service  As needed . Drug use: no   ROS:  GEN/ HEENT: No fever, significant weight changes sweats headaches vision problems hearing changes, CV/ PULM; No chest pain shortness of breath cough, syncope,edema  change in exercise tolerance. GI /GU: No adominal pain, vomiting, change in bowel habits. No blood in the stool. No significant GU symptoms. SKIN/HEME: ,no acute skin rashes suspicious lesions or bleeding. No lymphadenopathy, nodules, masses.  NEURO/ PSYCH:  No neurologic signs such as weakness numbness. No depression anxiety. IMM/ Allergy: No unusual infections.  Allergy .   REST of 12 system review negative except as per HPI   Past Medical History  Diagnosis Date  . Acne   .  Concussion   . Shingles   . Anxiety     and pa nic; [facility] consult 2011  . History of chlamydia 12/28/2012  . HEAD TRAUMA, CLOSED 06/08/2007    Qualifier: Diagnosis of  By: Regis Bill MD, Standley Brooking     Past Surgical History  Procedure Laterality Date  . Adenoidectomy  age 16  . Myringotomy  10 months  . Wisdom tooth extraction      Family History  Problem Relation Age of Onset  . Mitral valve prolapse Father   . Hypertension Father   . Cancer Maternal Grandfather     PANCREATIC  . Cancer Paternal Grandfather     BRAIN TUMOR  . Hypertension Paternal Grandfather     Social History   Social History  . Marital Status: Single    Spouse Name: N/A  . Number of Children: N/A  . Years of Education: N/A   Social History Main Topics  . Smoking status: Never Smoker   . Smokeless tobacco: Never Used  . Alcohol Use: 0.0 oz/week    0 Standard drinks or equivalent per week     Comment: Rare  . Drug Use: No  . Sexual Activity: Yes    Birth Control/ Protection: Pill     Comment: 1st intercourse 24 yo-Fewer than 5 partners  Other Topics Concern  . 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  Now senior  [employer] and jewelry making    In [place] 1 house mates.     Outpatient Prescriptions Prior to Visit  Medication Sig Dispense Refill  . amphetamine-dextroamphetamine (ADDERALL XR) 15 MG 24 hr capsule Take 15 mg by mouth every morning.    . cetirizine (ZYRTEC) 10 MG tablet Take 10 mg by mouth daily.    . norgestimate-ethinyl estradiol (SPRINTEC 28) 0.25-35 MG-MCG tablet Take 1 tablet by mouth daily. 28 Package 11  . traZODone (DESYREL) 50 MG tablet Take 50 mg by mouth at bedtime.    . ALPRAZolam (XANAX) 0.5 MG tablet 0.5 mg.     . citalopram  (CELEXA) 20 MG tablet Take 40 mg by mouth daily.      No facility-administered medications prior to visit.     EXAM:  BP 106/62 mmHg  Temp(Src) 99.1 F (37.3 C) (Oral)  Ht 5' 3"  (1.6 m)  Wt 121 lb 3.2 oz (54.976 kg)  BMI 21.48 kg/m2  LMP 02/09/2015  Body mass index is 21.48 kg/(m^2).  Physical Exam: Vital signs reviewed UKG:URKY is a well-developed well-nourished alert cooperative    who appearsr stated age in no acute distress.  HEENT: normocephalic atraumatic , Eyes: PERRL EOM's full, conjunctiva clear, Nares: paten,t no deformity discharge or tenderness., Ears: no deformity EAC's clear TMs with normal landmarks. Mouth: clear OP, no lesions, edema.  Tonsil 1+ airway clear  Moist mucous membranes. Dentition in adequate repair. NECK: supple without masses, thyromegaly or bruits. CHEST/PULM:  Clear to auscultation and percussion breath sounds equal no wheeze , rales or rhonchi. No chest wall deformities or tenderness. Breast no masses  CV: PMI is nondisplaced, S1 S2 no gallops, murmurs, rubs. Peripheral pulses are full without delay.No JVD .  ABDOMEN: Bowel sounds normal nontender  No guard or rebound, no hepato splenomegal no CVA tenderness.  Extremtities:  No clubbing cyanosis or edema, no acute joint swelling or redness no focal atrophy right foot crepitus over extensor tendon great toe  NEURO:  Oriented x3, cranial nerves 3-12 appear to be intact, no obvious focal weakness,gait within normal limits no abnormal reflexes or asymmetrical SKIN: No acute rashes normal turgor, color, no bruising or petechiae. PSYCH: Oriented, good eye contact, no obvious depression anxiety, cognition and judgment appear normal. LN: no cervical axillary inguinal adenopathy  Lab Results  Component Value Date   WBC 8.8 01/12/2014   HGB 13.3 01/12/2014   HCT 39.4 01/12/2014   PLT 196.0 01/12/2014   GLUCOSE 92 01/12/2014   CHOL 134 01/12/2014   TRIG 92.0 01/12/2014   HDL 69.00 01/12/2014   LDLCALC  47 01/12/2014   ALT 12 01/12/2014   AST 17 01/12/2014   NA 139 01/12/2014   K 4.9 01/12/2014   CL 107 01/12/2014   CREATININE 0.6 01/12/2014   BUN 11 01/12/2014   CO2 26 01/12/2014   TSH 1.22 01/12/2014    ASSESSMENT AND PLAN:  Discussed the following assessment and plan: UTD immunizations  Reviewed  Visit for preventive health examination Patient Care Team: Burnis Medin, MD  as PCP - General Delight Hoh, MD (Psychiatry) Danella Sensing, MD (Dermatology) Terrance Mass, MD (Obstetrics and Gynecology) Patient Instructions  You did have the HPV series .  Agree with seeing   Sports medicine about right foot pain  You have at least a significant tendinitis  Often helped by rest cross training and antiinflammatories   Continue lifestyle intervention healthy eating and exercise . Healthy lifestyle includes : At least 150 minutes of exercise weeks  , weight at healthy levels, which is usually   BMI 19-25. Avoid trans fats and processed foods;  Increase fresh fruits and veges to 5 servings per day. And avoid sweet beverages including tea and juice. Mediterranean diet with olive oil and nuts have been noted to be heart and brain healthy . Avoid tobacco products . Limit  alcohol to  7 per week for women and 14 servings for men.  Get adequate sleep . Wear seat belts . Don't text and drive .   Health Maintenance Adopting a healthy lifestyle and getting preventive care can go a long way to promote health and wellness. Talk with your health care provider about what schedule of regular examinations is right for you. This is a good chance for you to check in with your provider about disease prevention and staying healthy. In between checkups, there are plenty of things you can do on your own. Experts have done a lot of research about which lifestyle changes and preventive measures are most likely to keep you healthy. Ask your health care provider for more information. WEIGHT AND DIET    Eat a healthy diet  Be sure to include plenty of vegetables, fruits, low-fat dairy products, and lean protein.  Do not eat a lot of foods high in solid fats, added sugars, or salt.  Get regular exercise. This is one of the most important things you can do for your health.  Most adults should exercise for at least 150 minutes each week. The exercise should increase your heart rate and make you sweat (moderate-intensity exercise).  Most adults should also do strengthening exercises at least twice a week. This is in addition to the moderate-intensity exercise.  Maintain a healthy weight  Body mass index (BMI) is a measurement that can be used to identify possible weight problems. It estimates body fat based on height and weight. Your health care provider can help determine your BMI and help you achieve or maintain a healthy weight.  For females 80 years of age and older:   A BMI below 18.5 is considered underweight.  A BMI of 18.5 to 24.9 is normal.  A BMI of 25 to 29.9 is considered overweight.  A BMI of 30 and above is considered obese.  Watch levels of cholesterol and blood lipids  You should start having your blood tested for lipids and cholesterol at 22 years of age, then have this test every 5 years.  You may need to have your cholesterol levels checked more often if:  Your lipid or cholesterol levels are high.  You are older than 22 years of age.  You are at high risk for heart disease.  CANCER SCREENING   Lung Cancer  Lung cancer screening is recommended for adults 1-23 years old who are at high risk for lung cancer because of a history of smoking.  A yearly low-dose CT scan of the lungs is recommended for people who:  Currently smoke.  Have quit within the past 15 years.  Have at  least a 30-pack-year history of smoking. A pack year is smoking an average of one pack of cigarettes a day for 1 year.  Yearly screening should continue until it has been 15  years since you quit.  Yearly screening should stop if you develop a health problem that would prevent you from having lung cancer treatment.  Breast Cancer  Practice breast self-awareness. This means understanding how your breasts normally appear and feel.  It also means doing regular breast self-exams. Let your health care provider know about any changes, no matter how small.  If you are in your 20s or 30s, you should have a clinical breast exam (CBE) by a health care provider every 1-3 years as part of a regular health exam.  If you are 37 or older, have a CBE every year. Also consider having a breast X-ray (mammogram) every year.  If you have a family history of breast cancer, talk to your health care provider about genetic screening.  If you are at high risk for breast cancer, talk to your health care provider about having an MRI and a mammogram every year.  Breast cancer gene (BRCA) assessment is recommended for women who have family members with BRCA-related cancers. BRCA-related cancers include:  Breast.  Ovarian.  Tubal.  Peritoneal cancers.  Results of the assessment will determine the need for genetic counseling and BRCA1 and BRCA2 testing. Cervical Cancer Routine pelvic examinations to screen for cervical cancer are no longer recommended for nonpregnant women who are considered low risk for cancer of the pelvic organs (ovaries, uterus, and vagina) and who do not have symptoms. A pelvic examination may be necessary if you have symptoms including those associated with pelvic infections. Ask your health care provider if a screening pelvic exam is right for you.   The Pap test is the screening test for cervical cancer for women who are considered at risk.  If you had a hysterectomy for a problem that was not cancer or a condition that could lead to cancer, then you no longer need Pap tests.  If you are older than 65 years, and you have had normal Pap tests for the past 10  years, you no longer need to have Pap tests.  If you have had past treatment for cervical cancer or a condition that could lead to cancer, you need Pap tests and screening for cancer for at least 20 years after your treatment.  If you no longer get a Pap test, assess your risk factors if they change (such as having a new sexual partner). This can affect whether you should start being screened again.  Some women have medical problems that increase their chance of getting cervical cancer. If this is the case for you, your health care provider may recommend more frequent screening and Pap tests.  The human papillomavirus (HPV) test is another test that may be used for cervical cancer screening. The HPV test looks for the virus that can cause cell changes in the cervix. The cells collected during the Pap test can be tested for HPV.  The HPV test can be used to screen women 23 years of age and older. Getting tested for HPV can extend the interval between normal Pap tests from three to five years.  An HPV test also should be used to screen women of any age who have unclear Pap test results.  After 22 years of age, women should have HPV testing as often as Pap tests.  Colorectal Cancer  This type of cancer can be detected and often prevented.  Routine colorectal cancer screening usually begins at 22 years of age and continues through 22 years of age.  Your health care provider may recommend screening at an earlier age if you have risk factors for colon cancer.  Your health care provider may also recommend using home test kits to check for hidden blood in the stool.  A small camera at the end of a tube can be used to examine your colon directly (sigmoidoscopy or colonoscopy). This is done to check for the earliest forms of colorectal cancer.  Routine screening usually begins at age 46.  Direct examination of the colon should be repeated every 5-10 years through 22 years of age. However, you may  need to be screened more often if early forms of precancerous polyps or small growths are found. Skin Cancer  Check your skin from head to toe regularly.  Tell your health care provider about any new moles or changes in moles, especially if there is a change in a mole's shape or color.  Also tell your health care provider if you have a mole that is larger than the size of a pencil eraser.  Always use sunscreen. Apply sunscreen liberally and repeatedly throughout the day.  Protect yourself by wearing long sleeves, pants, a wide-brimmed hat, and sunglasses whenever you are outside. HEART DISEASE, DIABETES, AND HIGH BLOOD PRESSURE   Have your blood pressure checked at least every 1-2 years. High blood pressure causes heart disease and increases the risk of stroke.  If you are between 4 years and 61 years old, ask your health care provider if you should take aspirin to prevent strokes.  Have regular diabetes screenings. This involves taking a blood sample to check your fasting blood sugar level.  If you are at a normal weight and have a low risk for diabetes, have this test once every three years after 22 years of age.  If you are overweight and have a high risk for diabetes, consider being tested at a younger age or more often. PREVENTING INFECTION  Hepatitis B  If you have a higher risk for hepatitis B, you should be screened for this virus. You are considered at high risk for hepatitis B if:  You were born in a country where hepatitis B is common. Ask your health care provider which countries are considered high risk.  Your parents were born in a high-risk country, and you have not been immunized against hepatitis B (hepatitis B vaccine).  You have HIV or AIDS.  You use needles to inject street drugs.  You live with someone who has hepatitis B.  You have had sex with someone who has hepatitis B.  You get hemodialysis treatment.  You take certain medicines for conditions,  including cancer, organ transplantation, and autoimmune conditions. Hepatitis C  Blood testing is recommended for:  Everyone born from [birth year] through 1963.  Anyone with known risk factors for hepatitis C. Sexually transmitted infections (STIs)  You should be screened for sexually transmitted infections (STIs) including gonorrhea and chlamydia if:  You are sexually active and are younger than 22 years of age.  You are older than 22 years of age and your health care provider tells you that you are at risk for this type of infection.  Your sexual activity has changed since you were last screened and you are at an increased risk for chlamydia or gonorrhea. Ask your health care provider if you are  at risk.  If you do not have HIV, but are at risk, it may be recommended that you take a prescription medicine daily to prevent HIV infection. This is called pre-exposure prophylaxis (PrEP). You are considered at risk if:  You are sexually active and do not regularly use condoms or know the HIV status of your partner(s).  You take drugs by injection.  You are sexually active with a partner who has HIV. Talk with your health care provider about whether you are at high risk of being infected with HIV. If you choose to begin PrEP, you should first be tested for HIV. You should then be tested every 3 months for as long as you are taking PrEP.  PREGNANCY   If you are premenopausal and you may become pregnant, ask your health care provider about preconception counseling.  If you may become pregnant, take 400 to 800 micrograms (mcg) of folic acid every day.  If you want to prevent pregnancy, talk to your health care provider about birth control (contraception). OSTEOPOROSIS AND MENOPAUSE   Osteoporosis is a disease in which the bones lose minerals and strength with aging. This can result in serious bone fractures. Your risk for osteoporosis can be identified using a bone density scan.  If you are 51  years of age or older, or if you are at risk for osteoporosis and fractures, ask your health care provider if you should be screened.  Ask your health care provider whether you should take a calcium or vitamin D supplement to lower your risk for osteoporosis.  Menopause may have certain physical symptoms and risks.  Hormone replacement therapy may reduce some of these symptoms and risks. Talk to your health care provider about whether hormone replacement therapy is right for you.  HOME CARE INSTRUCTIONS   Schedule regular health, dental, and eye exams.  Stay current with your immunizations.   Do not use any tobacco products including cigarettes, chewing tobacco, or electronic cigarettes.  If you are pregnant, do not drink alcohol.  If you are breastfeeding, limit how much and how often you drink alcohol.  Limit alcohol intake to no more than 1 drink per day for nonpregnant women. One drink equals 12 ounces of beer, 5 ounces of wine, or 1 ounces of hard liquor.  Do not use street drugs.  Do not share needles.  Ask your health care provider for help if you need support or information about quitting drugs.  Tell your health care provider if you often feel depressed.  Tell your health care provider if you have ever been abused or do not feel safe at home. Document Released: 12/16/2010 Document Revised: 10/17/2013 Document Reviewed: 05/04/2013 [facility] Patient Information 2015 [place], [place]. This information is not intended to replace advice given to you by your health care provider. Make sure you discuss any questions you have with your health care provider.       Standley Brooking. Mansur Patti M.D.

## 2015-03-19 NOTE — Telephone Encounter (Signed)
Patient had contacted me on 03/04/15 via -email in My Chart. I e-mailed her back with a response. Today that e-mail was returned to me with an "unread message alert".  I called her and per DPR access left a message on her voice mail relaying my e-mail message to her.  "Kathleen Hayes,  I went in your chart to see if I could release your Prolactin level result for you but it was released at 03/05/15 at 12:09am. Have you checked it this morning to see if available now? Dr. Lily Peer said to relay to you "Please inform her that all her labs were normal. Since she continues to have these chronic headaches on a regular basis I would recommend that she be seen by the neurologist." He recommended Dr. Vela Prose neurologist for further evaluation. If you would like [place] to refer you to Dr. Vela Prose just let me know and I will inform our referral coordinator.  Kathleen Hayes., RMA"  I left my direct phone number for her to call me back.

## 2015-03-19 NOTE — Patient Instructions (Signed)
You did have the HPV series .  Agree with seeing   Sports medicine about right foot pain  You have at least a significant tendinitis  Often helped by rest cross training and antiinflammatories   Continue lifestyle intervention healthy eating and exercise . Healthy lifestyle includes : At least 150 minutes of exercise weeks  , weight at healthy levels, which is usually   BMI 19-25. Avoid trans fats and processed foods;  Increase fresh fruits and veges to 5 servings per day. And avoid sweet beverages including tea and juice. Mediterranean diet with olive oil and nuts have been noted to be heart and brain healthy . Avoid tobacco products . Limit  alcohol to  7 per week for women and 14 servings for men.  Get adequate sleep . Wear seat belts . Don't text and drive .   Health Maintenance Adopting a healthy lifestyle and getting preventive care can go a long way to promote health and wellness. Talk with your health care provider about what schedule of regular examinations is right for you. This is a good chance for you to check in with your provider about disease prevention and staying healthy. In between checkups, there are plenty of things you can do on your own. Experts have done a lot of research about which lifestyle changes and preventive measures are most likely to keep you healthy. Ask your health care provider for more information. WEIGHT AND DIET  Eat a healthy diet  Be sure to include plenty of vegetables, fruits, low-fat dairy products, and lean protein.  Do not eat a lot of foods high in solid fats, added sugars, or salt.  Get regular exercise. This is one of the most important things you can do for your health.  Most adults should exercise for at least 150 minutes each week. The exercise should increase your heart rate and make you sweat (moderate-intensity exercise).  Most adults should also do strengthening exercises at least twice a week. This is in addition to the  moderate-intensity exercise.  Maintain a healthy weight  Body mass index (BMI) is a measurement that can be used to identify possible weight problems. It estimates body fat based on height and weight. Your health care provider can help determine your BMI and help you achieve or maintain a healthy weight.  For females 16 years of age and older:   A BMI below 18.5 is considered underweight.  A BMI of 18.5 to 24.9 is normal.  A BMI of 25 to 29.9 is considered overweight.  A BMI of 30 and above is considered obese.  Watch levels of cholesterol and blood lipids  You should start having your blood tested for lipids and cholesterol at 22 years of age, then have this test every 5 years.  You may need to have your cholesterol levels checked more often if:  Your lipid or cholesterol levels are high.  You are older than 22 years of age.  You are at high risk for heart disease.  CANCER SCREENING   Lung Cancer  Lung cancer screening is recommended for adults 14-15 years old who are at high risk for lung cancer because of a history of smoking.  A yearly low-dose CT scan of the lungs is recommended for people who:  Currently smoke.  Have quit within the past 15 years.  Have at least a 30-pack-year history of smoking. A pack year is smoking an average of one pack of cigarettes a day for 1 year.  Yearly screening should continue until it has been 15 years since you quit.  Yearly screening should stop if you develop a health problem that would prevent you from having lung cancer treatment.  Breast Cancer  Practice breast self-awareness. This means understanding how your breasts normally appear and feel.  It also means doing regular breast self-exams. Let your health care provider know about any changes, no matter how small.  If you are in your 20s or 30s, you should have a clinical breast exam (CBE) by a health care provider every 1-3 years as part of a regular health exam.  If  you are 77 or older, have a CBE every year. Also consider having a breast X-ray (mammogram) every year.  If you have a family history of breast cancer, talk to your health care provider about genetic screening.  If you are at high risk for breast cancer, talk to your health care provider about having an MRI and a mammogram every year.  Breast cancer gene (BRCA) assessment is recommended for women who have family members with BRCA-related cancers. BRCA-related cancers include:  Breast.  Ovarian.  Tubal.  Peritoneal cancers.  Results of the assessment will determine the need for genetic counseling and BRCA1 and BRCA2 testing. Cervical Cancer Routine pelvic examinations to screen for cervical cancer are no longer recommended for nonpregnant women who are considered low risk for cancer of the pelvic organs (ovaries, uterus, and vagina) and who do not have symptoms. A pelvic examination may be necessary if you have symptoms including those associated with pelvic infections. Ask your health care provider if a screening pelvic exam is right for you.   The Pap test is the screening test for cervical cancer for women who are considered at risk.  If you had a hysterectomy for a problem that was not cancer or a condition that could lead to cancer, then you no longer need Pap tests.  If you are older than 65 years, and you have had normal Pap tests for the past 10 years, you no longer need to have Pap tests.  If you have had past treatment for cervical cancer or a condition that could lead to cancer, you need Pap tests and screening for cancer for at least 20 years after your treatment.  If you no longer get a Pap test, assess your risk factors if they change (such as having a new sexual partner). This can affect whether you should start being screened again.  Some women have medical problems that increase their chance of getting cervical cancer. If this is the case for you, your health care  provider may recommend more frequent screening and Pap tests.  The human papillomavirus (HPV) test is another test that may be used for cervical cancer screening. The HPV test looks for the virus that can cause cell changes in the cervix. The cells collected during the Pap test can be tested for HPV.  The HPV test can be used to screen women 60 years of age and older. Getting tested for HPV can extend the interval between normal Pap tests from three to five years.  An HPV test also should be used to screen women of any age who have unclear Pap test results.  After 22 years of age, women should have HPV testing as often as Pap tests.  Colorectal Cancer  This type of cancer can be detected and often prevented.  Routine colorectal cancer screening usually begins at 22 years of age and continues  through 22 years of age.  Your health care provider may recommend screening at an earlier age if you have risk factors for colon cancer.  Your health care provider may also recommend using home test kits to check for hidden blood in the stool.  A small camera at the end of a tube can be used to examine your colon directly (sigmoidoscopy or colonoscopy). This is done to check for the earliest forms of colorectal cancer.  Routine screening usually begins at age 11.  Direct examination of the colon should be repeated every 5-10 years through 22 years of age. However, you may need to be screened more often if early forms of precancerous polyps or small growths are found. Skin Cancer  Check your skin from head to toe regularly.  Tell your health care provider about any new moles or changes in moles, especially if there is a change in a mole's shape or color.  Also tell your health care provider if you have a mole that is larger than the size of a pencil eraser.  Always use sunscreen. Apply sunscreen liberally and repeatedly throughout the day.  Protect yourself by wearing long sleeves, pants, a  wide-brimmed hat, and sunglasses whenever you are outside. HEART DISEASE, DIABETES, AND HIGH BLOOD PRESSURE   Have your blood pressure checked at least every 1-2 years. High blood pressure causes heart disease and increases the risk of stroke.  If you are between 41 years and 7 years old, ask your health care provider if you should take aspirin to prevent strokes.  Have regular diabetes screenings. This involves taking a blood sample to check your fasting blood sugar level.  If you are at a normal weight and have a low risk for diabetes, have this test once every three years after 22 years of age.  If you are overweight and have a high risk for diabetes, consider being tested at a younger age or more often. PREVENTING INFECTION  Hepatitis B  If you have a higher risk for hepatitis B, you should be screened for this virus. You are considered at high risk for hepatitis B if:  You were born in a country where hepatitis B is common. Ask your health care provider which countries are considered high risk.  Your parents were born in a high-risk country, and you have not been immunized against hepatitis B (hepatitis B vaccine).  You have HIV or AIDS.  You use needles to inject street drugs.  You live with someone who has hepatitis B.  You have had sex with someone who has hepatitis B.  You get hemodialysis treatment.  You take certain medicines for conditions, including cancer, organ transplantation, and autoimmune conditions. Hepatitis C  Blood testing is recommended for:  Everyone born from [birth year] through 1963.  Anyone with known risk factors for hepatitis C. Sexually transmitted infections (STIs)  You should be screened for sexually transmitted infections (STIs) including gonorrhea and chlamydia if:  You are sexually active and are younger than 22 years of age.  You are older than 22 years of age and your health care provider tells you that you are at risk for this type of  infection.  Your sexual activity has changed since you were last screened and you are at an increased risk for chlamydia or gonorrhea. Ask your health care provider if you are at risk.  If you do not have HIV, but are at risk, it may be recommended that you take a prescription medicine daily  to prevent HIV infection. This is called pre-exposure prophylaxis (PrEP). You are considered at risk if:  You are sexually active and do not regularly use condoms or know the HIV status of your partner(s).  You take drugs by injection.  You are sexually active with a partner who has HIV. Talk with your health care provider about whether you are at high risk of being infected with HIV. If you choose to begin PrEP, you should first be tested for HIV. You should then be tested every 3 months for as long as you are taking PrEP.  PREGNANCY   If you are premenopausal and you may become pregnant, ask your health care provider about preconception counseling.  If you may become pregnant, take 400 to 800 micrograms (mcg) of folic acid every day.  If you want to prevent pregnancy, talk to your health care provider about birth control (contraception). OSTEOPOROSIS AND MENOPAUSE   Osteoporosis is a disease in which the bones lose minerals and strength with aging. This can result in serious bone fractures. Your risk for osteoporosis can be identified using a bone density scan.  If you are 5 years of age or older, or if you are at risk for osteoporosis and fractures, ask your health care provider if you should be screened.  Ask your health care provider whether you should take a calcium or vitamin D supplement to lower your risk for osteoporosis.  Menopause may have certain physical symptoms and risks.  Hormone replacement therapy may reduce some of these symptoms and risks. Talk to your health care provider about whether hormone replacement therapy is right for you.  HOME CARE INSTRUCTIONS   Schedule regular  health, dental, and eye exams.  Stay current with your immunizations.   Do not use any tobacco products including cigarettes, chewing tobacco, or electronic cigarettes.  If you are pregnant, do not drink alcohol.  If you are breastfeeding, limit how much and how often you drink alcohol.  Limit alcohol intake to no more than 1 drink per day for nonpregnant women. One drink equals 12 ounces of beer, 5 ounces of wine, or 1 ounces of hard liquor.  Do not use street drugs.  Do not share needles.  Ask your health care provider for help if you need support or information about quitting drugs.  Tell your health care provider if you often feel depressed.  Tell your health care provider if you have ever been abused or do not feel safe at home. Document Released: 12/16/2010 Document Revised: 10/17/2013 Document Reviewed: 05/04/2013 [facility] Patient Information 2015 [place], [place]. This information is not intended to replace advice given to you by your health care provider. Make sure you discuss any questions you have with your health care provider.

## 2015-12-16 ENCOUNTER — Other Ambulatory Visit: Payer: Self-pay | Admitting: Gynecology

## 2016-03-17 ENCOUNTER — Other Ambulatory Visit: Payer: Self-pay | Admitting: Gynecology

## 2016-04-07 ENCOUNTER — Other Ambulatory Visit: Payer: Self-pay

## 2016-04-08 ENCOUNTER — Other Ambulatory Visit: Payer: Self-pay

## 2016-04-08 ENCOUNTER — Telehealth: Payer: Self-pay | Admitting: *Deleted

## 2016-04-08 MED ORDER — NORGESTIMATE-ETH ESTRADIOL 0.25-35 MG-MCG PO TABS: 1.0000 | ORAL_TABLET | Freq: Every day | ORAL | 0 refills | Status: DC

## 2016-04-08 NOTE — Telephone Encounter (Signed)
Pt has annual scheduled on 04/21/16 needs refill on sprintec.1 month supply sent to pharmacy.

## 2016-04-09 ENCOUNTER — Other Ambulatory Visit: Payer: Self-pay

## 2016-04-09 MED ORDER — NORGESTIMATE-ETH ESTRADIOL 0.25-35 MG-MCG PO TABS: 1.0000 | ORAL_TABLET | Freq: Every day | ORAL | 1 refills | Status: DC

## 2016-04-21 ENCOUNTER — Ambulatory Visit (INDEPENDENT_AMBULATORY_CARE_PROVIDER_SITE_OTHER): Payer: BLUE CROSS/BLUE SHIELD | Admitting: Gynecology

## 2016-04-21 ENCOUNTER — Encounter: Payer: Self-pay | Admitting: Gynecology

## 2016-04-21 VITALS — BP 118/78 | Ht 63.0 in | Wt 127.0 lb

## 2016-04-21 DIAGNOSIS — Z01419 Encounter for gynecological examination (general) (routine) without abnormal findings: Secondary | ICD-10-CM | POA: Diagnosis not present

## 2016-04-21 DIAGNOSIS — Z113 Encounter for screening for infections with a predominantly sexual mode of transmission: Secondary | ICD-10-CM | POA: Diagnosis not present

## 2016-04-21 MED ORDER — NORGESTIMATE-ETH ESTRADIOL 0.25-35 MG-MCG PO TABS: 1.0000 | ORAL_TABLET | Freq: Every day | ORAL | 11 refills | Status: DC

## 2016-04-21 NOTE — Progress Notes (Signed)
Kathleen GibbonsJennifer Hayes 01/31/1991 [id number]   History:    25 y.o.  for annual gyn exam who presented to the office today for her annual exam. Patient is asymptomatic doing well on her oral contraceptive pill and having normal menstrual cycle. Patient several years ago completed the HPV virus vaccine series. Patient not interested in the flu vaccine. Her PCP has been doing her blood work. Patient has been with the same sexual partner for a year now. First Pap smear in 2015 was normal.  Past medical history,surgical history, family history and social history were all reviewed and documented in the EPIC chart.  Gynecologic History Patient's last menstrual period was 04/07/2016. Contraception: OCP (estrogen/progesterone) Last Pap: 2015. Results were: normal Last mammogram: Not indicated. Results were: Not indicated  Obstetric History OB History  Gravida Para Term Preterm AB Living  0            SAB TAB Ectopic Multiple Live Births                    ROS: A ROS was performed and pertinent positives and negatives are included in the history.  GENERAL: No fevers or chills. HEENT: No change in vision, no earache, sore throat or sinus congestion. NECK: No pain or stiffness. CARDIOVASCULAR: No chest pain or pressure. No palpitations. PULMONARY: No shortness of breath, cough or wheeze. GASTROINTESTINAL: No abdominal pain, nausea, vomiting or diarrhea, melena or bright red blood per rectum. GENITOURINARY: No urinary frequency, urgency, hesitancy or dysuria. MUSCULOSKELETAL: No joint or muscle pain, no back pain, no recent trauma. DERMATOLOGIC: No rash, no itching, no lesions. ENDOCRINE: No polyuria, polydipsia, no heat or cold intolerance. No recent change in weight. HEMATOLOGICAL: No anemia or easy bruising or bleeding. NEUROLOGIC: No headache, seizures, numbness, tingling or weakness. PSYCHIATRIC: No depression, no loss of interest in normal activity or change in sleep pattern.     Exam: chaperone  present  BP 118/78   Ht 5\' 3"  (1.6 m)   Wt 127 lb (57.6 kg)   LMP 04/07/2016   BMI 22.50 kg/m   Body mass index is 22.5 kg/m.  General appearance : Well developed well nourished female. No acute distress HEENT: Eyes: no retinal hemorrhage or exudates,  Neck supple, trachea midline, no carotid bruits, no thyroidmegaly Lungs: Clear to auscultation, no rhonchi or wheezes, or rib retractions  Heart: Regular rate and rhythm, no murmurs or gallops Breast:Examined in sitting and supine position were symmetrical in appearance, no palpable masses or tenderness,  no skin retraction, no nipple inversion, no nipple discharge, no skin discoloration, no axillary or supraclavicular lymphadenopathy Abdomen: no palpable masses or tenderness, no rebound or guarding Extremities: no edema or skin discoloration or tenderness  Pelvic:  Bartholin, Urethra, Skene Glands: Within normal limits             Vagina: No gross lesions or discharge  Cervix: No gross lesions or discharge  Uterus  anteverted, normal size, shape and consistency, non-tender and mobile  Adnexa  Without masses or tenderness  Anus and perineum  normal   Rectovaginal  normal sphincter tone without palpated masses or tenderness             Hemoccult not indicated     Assessment/Plan:  25 y.o. female for annual exam who declined the flu vaccine today. Her PCP Dr. Fabian SharpPanosh is been doing her blood work. First Pap smear in 2015 was normal next Pap smear due in 2018 according to new guidelines. GC  and chlamydia cultures part of STD screening was done today. Prescription refill for her oral contraceptive pill provided.   Ok EdwardsFERNANDEZ,Breuna Loveall H MD, 2:25 PM 04/21/2016

## 2016-04-22 LAB — GC/CHLAMYDIA PROBE AMP
CT Probe RNA: NOT DETECTED
GC PROBE AMP APTIMA: NOT DETECTED

## 2016-05-06 ENCOUNTER — Telehealth: Payer: Self-pay | Admitting: Internal Medicine

## 2016-05-06 NOTE — Telephone Encounter (Signed)
[facility] - Client TELEPHONE ADVICE RECORD [facility]  Patient Name: Kathleen GibbonsJENNIFER Kewley  DOB: 01/18/1991    Initial Comment Caller states daughter has sleeping a lot and she is having a headache with aches and pain. She is also having diarrhea.   Nurse Assessment  Nurse: Randell LoopWirman, RN, Chad CordialNancy Jo Date/Time (Eastern Time): 05/06/2016 4:29:21 PM  Confirm and document reason for call. If symptomatic, describe symptoms. You must click the next button to save text entered. ---Caller states no fever, started feeling bad last night about midnight, upset stomach, throwing up a lot and diarrhea, body aches, sleeping all day, vomiting about 3-4 days today and has kept soup down, has peed once today, no headache, diarrhea today about seven  Does the patient have any new or worsening symptoms? ---Yes  Will a triage be completed? ---Yes  Related visit to physician within the last 2 weeks? ---No  Does the PT have any chronic conditions? (i.e. diabetes, asthma, etc.) ---No  Is the patient pregnant or possibly pregnant? (Ask all females between the ages of 5412-55) ---No  Is this a behavioral health or substance abuse call? ---No     Guidelines    Guideline Title Affirmed Question Affirmed Notes  Vomiting MILD or MODERATE vomiting (e.g., 1 - 5 times / day) (all triage questions negative)    Final Disposition User   Home Care EnglewoodWirman, RN, Chad CordialNancy Jo    Comments  Signs and symptoms of flu, wish an appointment time tomorrow.  NOTE; 05/07/2016 745AM ARRIVAL TIME 730AM W/ Duane LopeOREY NAFZIGER, NP R/O FLU  note: Nurse Randell LoopWirman triaged patient upgraded her to 24 hour and appt made per Nurse Laural BenesJohnson with documentation into chart by Nurse Laural BenesJohnson.   Disagree/Comply: Comply

## 2016-05-07 ENCOUNTER — Encounter: Payer: Self-pay | Admitting: Adult Health

## 2016-05-07 ENCOUNTER — Ambulatory Visit (INDEPENDENT_AMBULATORY_CARE_PROVIDER_SITE_OTHER): Payer: BLUE CROSS/BLUE SHIELD | Admitting: Adult Health

## 2016-05-07 VITALS — BP 102/70 | Temp 98.7°F | Ht 63.0 in | Wt 121.9 lb

## 2016-05-07 DIAGNOSIS — K529 Noninfective gastroenteritis and colitis, unspecified: Secondary | ICD-10-CM | POA: Diagnosis not present

## 2016-05-07 LAB — CBC WITH DIFFERENTIAL/PLATELET
BASOS PCT: 0.5 % (ref 0.0–3.0)
Basophils Absolute: 0 10*3/uL (ref 0.0–0.1)
EOS PCT: 0.7 % (ref 0.0–5.0)
Eosinophils Absolute: 0 10*3/uL (ref 0.0–0.7)
HEMATOCRIT: 41.9 % (ref 36.0–46.0)
HEMOGLOBIN: 14.4 g/dL (ref 12.0–15.0)
LYMPHS PCT: 19.7 % (ref 12.0–46.0)
Lymphs Abs: 1 10*3/uL (ref 0.7–4.0)
MCHC: 34.5 g/dL (ref 30.0–36.0)
MCV: 90.4 fl (ref 78.0–100.0)
MONOS PCT: 8.2 % (ref 3.0–12.0)
Monocytes Absolute: 0.4 10*3/uL (ref 0.1–1.0)
Neutro Abs: 3.8 10*3/uL (ref 1.4–7.7)
Neutrophils Relative %: 70.9 % (ref 43.0–77.0)
Platelets: 218 10*3/uL (ref 150.0–400.0)
RBC: 4.64 Mil/uL (ref 3.87–5.11)
RDW: 12.5 % (ref 11.5–15.5)
WBC: 5.3 10*3/uL (ref 4.0–10.5)

## 2016-05-07 LAB — BASIC METABOLIC PANEL
BUN: 6 mg/dL (ref 6–23)
CALCIUM: 9.2 mg/dL (ref 8.4–10.5)
CHLORIDE: 102 meq/L (ref 96–112)
CO2: 25 meq/L (ref 19–32)
Creatinine, Ser: 0.63 mg/dL (ref 0.40–1.20)
GFR: 123.69 mL/min (ref >60.00)
GLUCOSE: 117 mg/dL — AB (ref 70–99)
POTASSIUM: 3.8 meq/L (ref 3.5–5.1)
SODIUM: 137 meq/L (ref 135–145)

## 2016-05-07 MED ORDER — ONDANSETRON HCL 4 MG PO TABS: 4.0000 mg | ORAL_TABLET | Freq: Three times a day (TID) | ORAL | 0 refills | Status: DC | PRN

## 2016-05-07 NOTE — Addendum Note (Signed)
Addended by: Baldwin CrownJOHNSON, Bessie Livingood D on: 05/07/2016 08:32 AM   Modules accepted: Orders

## 2016-05-07 NOTE — Patient Instructions (Addendum)
It was great meeting you today  Your exam is consistent with the stomach flu   I will follow up with you regarding your lab work.   I have sent in a prescription for Zofran- this will help with nausea.   Please start taking Pepto or Imodium for the diarrhea.       Viral Gastroenteritis, Adult Introduction Viral gastroenteritis is also known as the stomach flu. This condition is caused by certain germs (viruses). These germs can be passed from person to person very easily (are very contagious). This condition can cause sudden watery poop (diarrhea), fever, and throwing up (vomiting). Having watery poop and throwing up can make you feel weak and cause you to get dehydrated. Dehydration can make you tired and thirsty, make you have a dry mouth, and make it so you pee (urinate) less often. Older adults and people with other diseases or a weak defense system (immune system) are at higher risk for dehydration. It is important to replace the fluids that you lose from having watery poop and throwing up. Follow these instructions at home: Follow instructions from your doctor about how to care for yourself at home. Eating and drinking Follow these instructions as told by your doctor:  Take an oral rehydration solution (ORS). This is a drink that is sold at pharmacies and stores.  Drink clear fluids in small amounts as you are able, such as:  Water.  Ice chips.  Diluted fruit juice.  Low-calorie sports drinks.  Eat bland, easy-to-digest foods in small amounts as you are able, such as:  Bananas.  Applesauce.  Rice.  Low-fat (lean) meats.  Toast.  Crackers.  Avoid fluids that have a lot of sugar or caffeine in them.  Avoid alcohol.  Avoid spicy or fatty foods. General instructions  Drink enough fluid to keep your pee (urine) clear or pale yellow.  Wash your hands often. If you cannot use soap and water, use hand sanitizer.  Make sure that all people in your home wash  their hands well and often.  Rest at home while you get better.  Take over-the-counter and prescription medicines only as told by your doctor.  Watch your condition for any changes.  Take a warm bath to help with any burning or pain from having watery poop.  Keep all follow-up visits as told by your doctor. This is important. Contact a doctor if:  You cannot keep fluids down.  Your symptoms get worse.  You have new symptoms.  You feel light-headed or dizzy.  You have muscle cramps. Get help right away if:  You have chest pain.  You feel very weak or you pass out (faint).  You see blood in your throw-up.  Your throw-up looks like coffee grounds.  You have bloody or black poop (stools) or poop that look like tar.  You have a very bad headache, a stiff neck, or both.  You have a rash.  You have very bad pain, cramping, or bloating in your belly (abdomen).  You have trouble breathing.  You are breathing very quickly.  Your heart is beating very quickly.  Your skin feels cold and clammy.  You feel confused.  You have pain when you pee.  You have signs of dehydration, such as:  Dark pee, hardly any pee, or no pee.  Cracked lips.  Dry mouth.  Sunken eyes.  Sleepiness.  Weakness. This information is not intended to replace advice given to you by your health care provider. Make  sure you discuss any questions you have with your health care provider. Document Released: 11/19/2007 Document Revised: 12/21/2015 Document Reviewed: 02/06/2015  2017 [employer]

## 2016-05-07 NOTE — Telephone Encounter (Signed)
Pt seen in office today.

## 2016-05-07 NOTE — Progress Notes (Signed)
Subjective:    Patient ID: Marvetta GibbonsJennifer Schow, female    DOB: 01/17/1991, 25 y.o.   MRN: [id number]  HPI  25 year old healthy female who presents to to the office today with the acute complaints of generalized body aches, nausea, vomiting, diarrhea. She has been able to keep liquids down but has not been able to keep any solids down.   She denies any fever.   Reports that she feels better today   The only thing she has taken over the counter has been Tylenol.   Review of Systems  Constitutional: Positive for activity change, appetite change and fatigue.  Eyes: Negative.   Respiratory: Negative.   Cardiovascular: Negative.   Gastrointestinal: Positive for abdominal pain, diarrhea, nausea and vomiting. Negative for blood in stool.  Genitourinary: Negative.   Musculoskeletal: Negative.   Skin: Negative.   Neurological: Negative.   All other systems reviewed and are negative.  Past Medical History:  Diagnosis Date  . Acne   . Anxiety    and pa nic; [facility]'s consult 2011  . Concussion   . HEAD TRAUMA, CLOSED 06/08/2007   Qualifier: Diagnosis of  By: Fabian SharpPanosh MD, Neta MendsWanda K   . History of chlamydia 12/28/2012  . Shingles     Social History   Social History  . Marital status: Single    Spouse name: N/A  . Number of children: N/A  . Years of education: N/A   Occupational History  . Not on file.   Social History Main Topics  . Smoking status: Never Smoker  . Smokeless tobacco: Never Used  . Alcohol use 0.0 oz/week     Comment: Rare  . Drug use: No  . Sexual activity: Yes    Birth control/ protection: Pill     Comment: 1st intercourse 25 yo-Fewer than 5 partners   Other Topics Concern  . 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     Past Surgical History:  Procedure Laterality Date  . ADENOIDECTOMY  age 89  . MYRINGOTOMY  10 months  . WISDOM TOOTH EXTRACTION      Family History  Problem Relation Age of Onset  . Mitral valve prolapse Father   . Hypertension Father   . Cancer Maternal Grandfather     PANCREATIC  . Cancer Paternal Grandfather     BRAIN TUMOR  . Hypertension Paternal Grandfather     Allergies  Allergen Reactions  . Sulfonamide Derivatives Hives    Current Outpatient Prescriptions on File Prior to Visit  Medication Sig Dispense Refill  . ALPRAZolam (XANAX) 1 MG tablet Take 1 mg by mouth daily as needed.  0  . amphetamine-dextroamphetamine (ADDERALL XR) 15 MG 24 hr capsule Take 15 mg by mouth every morning.    . cetirizine (ZYRTEC) 10 MG tablet Take 10 mg by mouth daily.    . citalopram (CELEXA) 40 MG tablet Take 40 mg by mouth daily.  0  . norgestimate-ethinyl estradiol (SPRINTEC 28) 0.25-35 MG-MCG tablet Take 1 tablet by mouth daily. 28  tablet 11   No current facility-administered medications on file prior to visit.     BP 102/70   Temp 98.7 F (37.1 C) (Oral)   Ht 5\' 3"  (1.6 m)   Wt 121 lb 14.4 oz (55.3 kg)   LMP 04/07/2016   BMI 21.59 kg/m       Objective:   Physical Exam  Constitutional: She appears well-developed and well-nourished. No distress.  Cardiovascular: Normal rate, regular rhythm, normal heart sounds and intact distal pulses.  Exam reveals no gallop and no friction rub.   No murmur heard. Pulmonary/Chest: Effort normal and breath sounds normal. No respiratory distress. She has no wheezes. She has no rales. She exhibits no tenderness.  Abdominal: Soft. Normal appearance and bowel sounds are normal. She exhibits no distension and no mass. There is no hepatosplenomegaly, splenomegaly or hepatomegaly. There is tenderness. There is no rebound, no guarding and no CVA tenderness.  Neurological: She  is alert.  Skin: Skin is warm and dry. No rash noted. She is not diaphoretic. No erythema. No pallor.  Psychiatric: She has a normal mood and affect. Her behavior is normal. Thought content normal.  Nursing note and vitals reviewed.     Assessment & Plan:  1. Gastroenteritis - CBC with Differential/Platelet - Basic metabolic panel; Future - ondansetron (ZOFRAN) 4 MG tablet; Take 1 tablet (4 mg total) by mouth every 8 (eight) hours as needed for nausea or vomiting.  Dispense: 20 tablet; Refill: 0 - Stay hydrated and rest - She can take Imodium or Pepto until she has a formed bowel movement - Follow up if no improvement or sooner if needed  Shirline Freesory Johnie Makki, NP

## 2016-05-22 ENCOUNTER — Telehealth: Payer: Self-pay | Admitting: Internal Medicine

## 2016-05-22 NOTE — Telephone Encounter (Signed)
Pt mom is requesting for her daughter to have cpx before end of year .Please advise

## 2016-05-23 NOTE — Telephone Encounter (Signed)
Pt has been sch

## 2016-05-23 NOTE — Telephone Encounter (Signed)
12 /29   At 3 pm ok

## 2016-06-12 NOTE — Progress Notes (Signed)
Pre visit review using our clinic review tool, if applicable. No additional management support is needed unless otherwise documented below in the visit note.  Chief Complaint  Patient presents with  . Annual Exam    HPI: Patient  Kathleen Hayes  25 y.o. comes in today for [facility] visit   [facility]   [place] [facility].    See psych in [place] reg for med back on adhd med  Doing ok at home  depr anx is controlled  ocps no problem has recovered from  Gi illness Health Maintenance  Topic Date Due  . INFLUENZA VACCINE  11/13/2016 (Originally 01/15/2016)  . PAP SMEAR  01/20/2017  . TETANUS/TDAP  12/06/2024  . HIV Screening  Completed   Health Maintenance Review LIFESTYLE:  Exercise:   Walk ad  Stress fracture  r foot.   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 GI /GU: No adominal pain, vomiting, change in bowel habits. No blood in the stool. No significant GU symptoms. SKIN/HEME: ,no acute skin rashes suspicious lesions or bleeding. No lymphadenopathy, nodules, masses.  NEURO/ PSYCH:  No neurologic signs such as weakness numbness. No depression anxiety. IMM/ Allergy: No unusual infections.  Allergy .   REST of 12 system review negative except as per HPI   Past Medical History:  Diagnosis Date  . Acne   . Anxiety    and pa nic; Jenning's consult 2011  . Concussion   . HEAD TRAUMA, CLOSED 06/08/2007   Qualifier: Diagnosis of  By: Regis Bill MD, Standley Brooking   . History of chlamydia 12/28/2012  . Shingles     Past Surgical History:  Procedure Laterality Date  . ADENOIDECTOMY  age 32  . MYRINGOTOMY  10 months  . WISDOM  TOOTH EXTRACTION      Family History  Problem Relation Age of Onset  . Mitral valve prolapse Father   . Hypertension Father   . Cancer Maternal Grandfather     PANCREATIC  . Cancer Paternal Grandfather     BRAIN TUMOR  . Hypertension Paternal Grandfather     Social History   Social History  . Marital status: Single    Spouse name: N/A  . Number of children: N/A  . Years of education: N/A   Social History Main Topics  . Smoking status: Never Smoker  . Smokeless tobacco: Never Used  . Alcohol use 0.0 oz/week     Comment: Rare  . Drug use: No  . Sexual activity: Yes    Birth control/ protection: Pill     Comment: 1st intercourse 25 yo-Fewer than 5 partners   Other Topics Concern  . 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 ALPRAZolam (XANAX) 1 MG tablet Take 1 mg by mouth daily as needed.  0  . amphetamine-dextroamphetamine (ADDERALL XR) 15 MG 24 hr capsule Take 15 mg by mouth every morning.    . cetirizine (ZYRTEC) 10 MG tablet Take 10 mg by mouth daily.    . citalopram (CELEXA) 40 MG tablet Take 40 mg by mouth daily.  0  . norgestimate-ethinyl estradiol (SPRINTEC 28) 0.25-35 MG-MCG tablet Take 1 tablet by mouth daily. 28 tablet 11  . ondansetron (ZOFRAN) 4 MG tablet Take 1 tablet (4 mg total) by mouth every 8 (eight) hours as needed for nausea or vomiting. 20 tablet 0   No facility-administered medications prior to visit.      EXAM:  BP 106/60 (BP Location: Right Arm, Patient Position: Sitting, Cuff Size: Normal)   Temp 98.8 F (37.1  C) (Oral)   Ht 5' 3.25" (1.607 m)   Wt 123 lb 1.6 oz (55.8 kg)   BMI 21.63 kg/m   Body mass index is 21.63 kg/m.  Physical Exam: Vital signs reviewed YCX:[id number] is a well-developed well-nourished alert cooperative    who appearsr stated age in no acute distress.  HEENT: normocephalic atraumatic , Eyes: PERRL EOM's full, conjunctiva clear, Nares: paten,t no deformity discharge or tenderness., Ears: no deformity EAC's clear TMs with normal landmarks. Mouth: clear OP, no lesions, edema.  Moist mucous membranes. Dentition in adequate repair. NECK: supple without masses, thyromegaly or bruits. CHEST/PULM:  Clear to auscultation and percussion breath sounds equal no wheeze , rales or rhonchi. No chest wall deformities or tenderness.Breast: normal by inspection . No dimpling, discharge, masses, tenderness or discharge . CV: PMI is nondisplaced, S1 S2 no gallops, murmurs, rubs. Peripheral pulses are full without delay.No JVD .  ABDOMEN: Bowel sounds normal nontender  No guard or rebound, no hepato splenomegal no CVA tenderness.  No hernia. Extremtities:  No clubbing cyanosis or edema, no acute joint swelling or redness no focal atrophy NEURO:  Oriented x3, cranial nerves 3-12 appear to be intact, no obvious focal weakness,gait within normal limits no abnormal reflexes or asymmetrical SKIN: No acute rashes normal turgor, color, no bruising or petechiae. PSYCH: Oriented, good eye contact, no obvious depression anxiety, cognition and judgment appear normal. LN: no cervical axillary inguinal adenopathy  Lab Results  Component Value Date   WBC 5.3 05/07/2016   HGB 14.4 05/07/2016   HCT 41.9 05/07/2016   PLT 218.0 05/07/2016   GLUCOSE 117 (H) 05/07/2016   CHOL 134 01/12/2014   TRIG 92.0 01/12/2014   HDL 69.00 01/12/2014   LDLCALC 47 01/12/2014   ALT 12 01/12/2014   AST 17 01/12/2014   NA 137 05/07/2016   K 3.8 05/07/2016   CL 102 05/07/2016   CREATININE 0.63 05/07/2016   BUN 6 05/07/2016    CO2 25 05/07/2016   TSH 1.22 01/12/2014  reviewed lab with patient  bg was done when she was sick Gi bug  mow better   ASSESSMENT AND PLAN:  Discussed the following assessment and plan:  Visit for preventive health examination Healthy can be seen every other year if seeing gyne and no problem or concerns or as needed  Patient Care Team: Burnis Medin, MD as PCP - General Danella Sensing, MD (Dermatology) Terrance Mass, MD (Obstetrics and Gynecology) Luther Parody (Psychiatry) Patient Instructions  Continue lifestyle intervention healthy eating and exercise . Glad you are doin well.  Healthy lifestyle includes : At least 150 minutes of exercise weeks  , weight at healthy levels, which is usually   BMI 19-25. Avoid trans fats and processed foods;  Increase fresh fruits and veges to 5 servings per day. And avoid sweet beverages including tea and juice. Mediterranean diet with olive oil and nuts have been noted to be heart and brain healthy . Avoid tobacco products . Limit  alcohol to  7 per week for women and 14 servings for men.  Get adequate sleep . 8 hours better than 7  Wear seat belts . Don't text and drive .   BP Readings from Last 3 Encounters:  06/13/16 106/60  05/07/16 102/70  04/21/16 118/78   Health Maintenance, Female Introduction Adopting a healthy lifestyle and getting preventive care can go a long way to promote health and wellness. Talk with your health care provider about what schedule of regular examinations is right for you. This is a good chance for you to check in with your provider about disease prevention and staying healthy. In between checkups, there are plenty of things you can do on your own. Experts have done a lot of research about which lifestyle changes and preventive measures are most likely to keep you healthy. Ask your health care provider for more information. Weight and diet Eat a healthy diet  Be sure to include plenty of vegetables, fruits,  low-fat dairy products, and lean protein.  Do not eat a lot of foods high in solid fats, added sugars, or salt.  Get regular exercise. This is one of the most important things you can do for your health.  Most adults should exercise for at least 150 minutes each week. The exercise should increase your heart rate and make you sweat (moderate-intensity exercise).  Most adults should also do strengthening exercises at least twice a week. This is in addition to the moderate-intensity exercise. Maintain a healthy weight  Body mass index (BMI) is a measurement that can be used to identify possible weight problems. It estimates body fat based on height and weight. Your health care provider can help determine your BMI and help you achieve or maintain a healthy weight.  For females 26 years of age and older:  A BMI below 18.5 is considered underweight.  A BMI of 18.5 to 24.9 is normal.  A BMI of 25 to 29.9 is considered overweight.  A BMI of 30 and above is considered obese. Watch levels of cholesterol and blood lipids  You should start having your blood tested for lipids and cholesterol at 23 years of age, then have this test every 5 years.  You may need to have your cholesterol levels checked more often if:  Your lipid or cholesterol levels are high.  You are older than 23 years of age.  You are at high risk for heart disease. Cancer screening Lung Cancer  Lung cancer screening is recommended for adults 83-78 years old who are at high risk for lung cancer because of a history of smoking.  A yearly low-dose CT scan of the lungs is recommended for people who:  Currently smoke.  Have quit within the past 15 years.  Have at least a 30-pack-year history of smoking. A pack year is smoking an average of one pack of cigarettes a day for 1 year.  Yearly screening should continue until it has been 15 years since you quit.  Yearly screening should stop if you develop a health problem  that would prevent you from having lung cancer treatment. Breast Cancer  Practice breast self-awareness. This means understanding how your breasts normally appear and feel.  It also means doing regular breast self-exams. Let your health care provider know about any changes, no matter how small.  If you are in your 20s or 30s, you should have a clinical breast exam (CBE) by a health care provider every 1-3 years as part of a regular health exam.  If you are 46 or older, have a CBE every year. Also consider having a breast X-ray (mammogram) every year.  If you have a family history of breast cancer, talk to your health care provider about genetic screening.  If you are at high risk for breast cancer, talk to your health care provider about having an MRI and a mammogram every year.  Breast cancer gene (BRCA) assessment is recommended for women who have family members with BRCA-related cancers. BRCA-related cancers include:  Breast.  Ovarian.  Tubal.  Peritoneal cancers.  Results of the assessment will determine the need for genetic counseling and BRCA1 and BRCA2 testing. Cervical Cancer  Your health care provider may recommend that you be screened regularly for cancer of the pelvic organs (ovaries, uterus, and vagina). This screening involves a pelvic examination, including checking for microscopic changes to the surface of your cervix (Pap test). You may be encouraged to have this screening done every 3 years, beginning at age 65.  For women ages 43-65, health care providers may recommend pelvic exams and Pap testing every 3 years, or they may recommend the Pap and pelvic exam, combined with testing for human papilloma virus (HPV), every 5 years. Some types of HPV increase your risk of cervical cancer. Testing for HPV may also be done on women of any age with unclear Pap test results.  Other health care providers may not recommend any screening for nonpregnant women who are considered  low risk for pelvic cancer and who do not have symptoms. Ask your health care provider if a screening pelvic exam is right for you.  If you have had past treatment for cervical cancer or a condition that could lead to cancer, you need Pap tests and screening for cancer for at least 20 years after your treatment. If Pap tests have been discontinued, your risk factors (such as having a new sexual partner) need to be reassessed to determine if screening should resume. Some women have medical problems that increase the chance of getting cervical cancer. In these cases, your health care provider may recommend more frequent screening and Pap tests. Colorectal Cancer  This type of cancer can be detected and often prevented.  Routine colorectal cancer screening usually begins at 23 years of age and continues through 23 years of age.  Your health care provider may recommend screening at an earlier age if you have risk factors for colon cancer.  Your health care provider may also recommend using home test kits to check for hidden blood in the stool.  A small camera at the end of a tube can be used to examine your colon directly (sigmoidoscopy or colonoscopy). This is done to check for the earliest forms of colorectal cancer.  Routine screening usually begins at age 28.  Direct examination of the colon should be repeated every 5-10 years through 23 years of age. However, you may need to be screened more often if early forms of precancerous polyps or  small growths are found. Skin Cancer  Check your skin from head to toe regularly.  Tell your health care provider about any new moles or changes in moles, especially if there is a change in a mole's shape or color.  Also tell your health care provider if you have a mole that is larger than the size of a pencil eraser.  Always use sunscreen. Apply sunscreen liberally and repeatedly throughout the day.  Protect yourself by wearing long sleeves, pants, a  wide-brimmed hat, and sunglasses whenever you are outside. Heart disease, diabetes, and high blood pressure  High blood pressure causes heart disease and increases the risk of stroke. High blood pressure is more likely to develop in:  People who have blood pressure in the high end of the normal range (130-139/85-89 mm Hg).  People who are overweight or obese.  People who are African American.  If you are 65-37 years of age, have your blood pressure checked every 3-5 years. If you are 63 years of age or older, have your blood pressure checked every year. You should have your blood pressure measured twice-once when you are at a hospital or clinic, and once when you are not at a hospital or clinic. Record the average of the two measurements. To check your blood pressure when you are not at a hospital or clinic, you can use:  An automated blood pressure machine at a pharmacy.  A home blood pressure monitor.  If you are between 21 years and 28 years old, ask your health care provider if you should take aspirin to prevent strokes.  Have regular diabetes screenings. This involves taking a blood sample to check your fasting blood sugar level.  If you are at a normal weight and have a low risk for diabetes, have this test once every three years after 23 years of age.  If you are overweight and have a high risk for diabetes, consider being tested at a younger age or more often. Preventing infection Hepatitis B  If you have a higher risk for hepatitis B, you should be screened for this virus. You are considered at high risk for hepatitis B if:  You were born in a country where hepatitis B is common. Ask your health care provider which countries are considered high risk.  Your parents were born in a high-risk country, and you have not been immunized against hepatitis B (hepatitis B vaccine).  You have HIV or AIDS.  You use needles to inject street drugs.  You live with someone who has  hepatitis B.  You have had sex with someone who has hepatitis B.  You get hemodialysis treatment.  You take certain medicines for conditions, including cancer, organ transplantation, and autoimmune conditions. Hepatitis C  Blood testing is recommended for:  Everyone born from [birth year] through 1963.  Anyone with known risk factors for hepatitis C. Sexually transmitted infections (STIs)  You should be screened for sexually transmitted infections (STIs) including gonorrhea and chlamydia if:  You are sexually active and are younger than 23 years of age.  You are older than 23 years of age and your health care provider tells you that you are at risk for this type of infection.  Your sexual activity has changed since you were last screened and you are at an increased risk for chlamydia or gonorrhea. Ask your health care provider if you are at risk.  If you do not have HIV, but are at risk, it may be recommended that  you take a prescription medicine daily to prevent HIV infection. This is called pre-exposure prophylaxis (PrEP). You are considered at risk if:  You are sexually active and do not regularly use condoms or know the HIV status of your partner(s).  You take drugs by injection.  You are sexually active with a partner who has HIV. Talk with your health care provider about whether you are at high risk of being infected with HIV. If you choose to begin PrEP, you should first be tested for HIV. You should then be tested every 3 months for as long as you are taking PrEP. Pregnancy  If you are premenopausal and you may become pregnant, ask your health care provider about preconception counseling.  If you may become pregnant, take 400 to 800 micrograms (mcg) of folic acid every day.  If you want to prevent pregnancy, talk to your health care provider about birth control (contraception). Osteoporosis and menopause  Osteoporosis is a disease in which the bones lose minerals and strength  with aging. This can result in serious bone fractures. Your risk for osteoporosis can be identified using a bone density scan.  If you are 83 years of age or older, or if you are at risk for osteoporosis and fractures, ask your health care provider if you should be screened.  Ask your health care provider whether you should take a calcium or vitamin D supplement to lower your risk for osteoporosis.  Menopause may have certain physical symptoms and risks.  Hormone replacement therapy may reduce some of these symptoms and risks. Talk to your health care provider about whether hormone replacement therapy is right for you. Follow these instructions at home:  Schedule regular health, dental, and eye exams.  Stay current with your immunizations.  Do not use any tobacco products including cigarettes, chewing tobacco, or electronic cigarettes.  If you are pregnant, do not drink alcohol.  If you are breastfeeding, limit how much and how often you drink alcohol.  Limit alcohol intake to no more than 1 drink per day for nonpregnant women. One drink equals 12 ounces of beer, 5 ounces of wine, or 1 ounces of hard liquor.  Do not use street drugs.  Do not share needles.  Ask your health care provider for help if you need support or information about quitting drugs.  Tell your health care provider if you often feel depressed.  Tell your health care provider if you have ever been abused or do not feel safe at home. This information is not intended to replace advice given to you by your health care provider. Make sure you discuss any questions you have with your health care provider. Document Released: 12/16/2010 Document Revised: 11/08/2015 Document Reviewed: 03/06/2015  2017 Elsevier      Mariann Laster K. Jermayne Sweeney M.D.

## 2016-06-13 ENCOUNTER — Encounter: Payer: Self-pay | Admitting: Internal Medicine

## 2016-06-13 ENCOUNTER — Ambulatory Visit (INDEPENDENT_AMBULATORY_CARE_PROVIDER_SITE_OTHER): Payer: BLUE CROSS/BLUE SHIELD | Admitting: Internal Medicine

## 2016-06-13 VITALS — BP 106/60 | Temp 98.8°F | Ht 63.25 in | Wt 123.1 lb

## 2016-06-13 DIAGNOSIS — Z0000 Encounter for general adult medical examination without abnormal findings: Secondary | ICD-10-CM | POA: Diagnosis not present

## 2016-06-13 NOTE — Patient Instructions (Addendum)
Continue lifestyle intervention healthy eating and exercise . Glad you are doin well.  Healthy lifestyle includes : At least 150 minutes of exercise weeks  , weight at healthy levels, which is usually   BMI 19-25. Avoid trans fats and processed foods;  Increase fresh fruits and veges to 5 servings per day. And avoid sweet beverages including tea and juice. Mediterranean diet with olive oil and nuts have been noted to be heart and brain healthy . Avoid tobacco products . Limit  alcohol to  7 per week for women and 14 servings for men.  Get adequate sleep . 8 hours better than 7  Wear seat belts . Don't text and drive .   BP Readings from Last 3 Encounters:  06/13/16 106/60  05/07/16 102/70  04/21/16 118/78   Health Maintenance, Female Introduction Adopting a healthy lifestyle and getting preventive care can go a long way to promote health and wellness. Talk with your health care provider about what schedule of regular examinations is right for you. This is a good chance for you to check in with your provider about disease prevention and staying healthy. In between checkups, there are plenty of things you can do on your own. Experts have done a lot of research about which lifestyle changes and preventive measures are most likely to keep you healthy. Ask your health care provider for more information. Weight and diet Eat a healthy diet  Be sure to include plenty of vegetables, fruits, low-fat dairy products, and lean protein.  Do not eat a lot of foods high in solid fats, added sugars, or salt.  Get regular exercise. This is one of the most important things you can do for your health.  Most adults should exercise for at least 150 minutes each week. The exercise should increase your heart rate and make you sweat (moderate-intensity exercise).  Most adults should also do strengthening exercises at least twice a week. This is in addition to the moderate-intensity exercise. Maintain a  healthy weight  Body mass index (BMI) is a measurement that can be used to identify possible weight problems. It estimates body fat based on height and weight. Your health care provider can help determine your BMI and help you achieve or maintain a healthy weight.  For females 63 years of age and older:  A BMI below 18.5 is considered underweight.  A BMI of 18.5 to 24.9 is normal.  A BMI of 25 to 29.9 is considered overweight.  A BMI of 30 and above is considered obese. Watch levels of cholesterol and blood lipids  You should start having your blood tested for lipids and cholesterol at 23 years of age, then have this test every 5 years.  You may need to have your cholesterol levels checked more often if:  Your lipid or cholesterol levels are high.  You are older than 23 years of age.  You are at high risk for heart disease. Cancer screening Lung Cancer  Lung cancer screening is recommended for adults 27-35 years old who are at high risk for lung cancer because of a history of smoking.  A yearly low-dose CT scan of the lungs is recommended for people who:  Currently smoke.  Have quit within the past 15 years.  Have at least a 30-pack-year history of smoking. A pack year is smoking an average of one pack of cigarettes a day for 1 year.  Yearly screening should continue until it has been 15 years since you quit.  Yearly screening should stop if you develop a health problem that would prevent you from having lung cancer treatment. Breast Cancer  Practice breast self-awareness. This means understanding how your breasts normally appear and feel.  It also means doing regular breast self-exams. Let your health care provider know about any changes, no matter how small.  If you are in your 20s or 30s, you should have a clinical breast exam (CBE) by a health care provider every 1-3 years as part of a regular health exam.  If you are 72 or older, have a CBE every year. Also  consider having a breast X-ray (mammogram) every year.  If you have a family history of breast cancer, talk to your health care provider about genetic screening.  If you are at high risk for breast cancer, talk to your health care provider about having an MRI and a mammogram every year.  Breast cancer gene (BRCA) assessment is recommended for women who have family members with BRCA-related cancers. BRCA-related cancers include:  Breast.  Ovarian.  Tubal.  Peritoneal cancers.  Results of the assessment will determine the need for genetic counseling and BRCA1 and BRCA2 testing. Cervical Cancer  Your health care provider may recommend that you be screened regularly for cancer of the pelvic organs (ovaries, uterus, and vagina). This screening involves a pelvic examination, including checking for microscopic changes to the surface of your cervix (Pap test). You may be encouraged to have this screening done every 3 years, beginning at age 57.  For women ages 25-65, health care providers may recommend pelvic exams and Pap testing every 3 years, or they may recommend the Pap and pelvic exam, combined with testing for human papilloma virus (HPV), every 5 years. Some types of HPV increase your risk of cervical cancer. Testing for HPV may also be done on women of any age with unclear Pap test results.  Other health care providers may not recommend any screening for nonpregnant women who are considered low risk for pelvic cancer and who do not have symptoms. Ask your health care provider if a screening pelvic exam is right for you.  If you have had past treatment for cervical cancer or a condition that could lead to cancer, you need Pap tests and screening for cancer for at least 20 years after your treatment. If Pap tests have been discontinued, your risk factors (such as having a new sexual partner) need to be reassessed to determine if screening should resume. Some women have medical problems that  increase the chance of getting cervical cancer. In these cases, your health care provider may recommend more frequent screening and Pap tests. Colorectal Cancer  This type of cancer can be detected and often prevented.  Routine colorectal cancer screening usually begins at 23 years of age and continues through 23 years of age.  Your health care provider may recommend screening at an earlier age if you have risk factors for colon cancer.  Your health care provider may also recommend using home test kits to check for hidden blood in the stool.  A small camera at the end of a tube can be used to examine your colon directly (sigmoidoscopy or colonoscopy). This is done to check for the earliest forms of colorectal cancer.  Routine screening usually begins at age 9.  Direct examination of the colon should be repeated every 5-10 years through 23 years of age. However, you may need to be screened more often if early forms of precancerous polyps or  small growths are found. Skin Cancer  Check your skin from head to toe regularly.  Tell your health care provider about any new moles or changes in moles, especially if there is a change in a mole's shape or color.  Also tell your health care provider if you have a mole that is larger than the size of a pencil eraser.  Always use sunscreen. Apply sunscreen liberally and repeatedly throughout the day.  Protect yourself by wearing long sleeves, pants, a wide-brimmed hat, and sunglasses whenever you are outside. Heart disease, diabetes, and high blood pressure  High blood pressure causes heart disease and increases the risk of stroke. High blood pressure is more likely to develop in:  People who have blood pressure in the high end of the normal range (130-139/85-89 mm Hg).  People who are overweight or obese.  People who are African American.  If you are 39-78 years of age, have your blood pressure checked every 3-5 years. If you are 57 years of  age or older, have your blood pressure checked every year. You should have your blood pressure measured twice-once when you are at a hospital or clinic, and once when you are not at a hospital or clinic. Record the average of the two measurements. To check your blood pressure when you are not at a hospital or clinic, you can use:  An automated blood pressure machine at a pharmacy.  A home blood pressure monitor.  If you are between 65 years and 71 years old, ask your health care provider if you should take aspirin to prevent strokes.  Have regular diabetes screenings. This involves taking a blood sample to check your fasting blood sugar level.  If you are at a normal weight and have a low risk for diabetes, have this test once every three years after 23 years of age.  If you are overweight and have a high risk for diabetes, consider being tested at a younger age or more often. Preventing infection Hepatitis B  If you have a higher risk for hepatitis B, you should be screened for this virus. You are considered at high risk for hepatitis B if:  You were born in a country where hepatitis B is common. Ask your health care provider which countries are considered high risk.  Your parents were born in a high-risk country, and you have not been immunized against hepatitis B (hepatitis B vaccine).  You have HIV or AIDS.  You use needles to inject street drugs.  You live with someone who has hepatitis B.  You have had sex with someone who has hepatitis B.  You get hemodialysis treatment.  You take certain medicines for conditions, including cancer, organ transplantation, and autoimmune conditions. Hepatitis C  Blood testing is recommended for:  Everyone born from [birth year] through 1963.  Anyone with known risk factors for hepatitis C. Sexually transmitted infections (STIs)  You should be screened for sexually transmitted infections (STIs) including gonorrhea and chlamydia if:  You are  sexually active and are younger than 23 years of age.  You are older than 23 years of age and your health care provider tells you that you are at risk for this type of infection.  Your sexual activity has changed since you were last screened and you are at an increased risk for chlamydia or gonorrhea. Ask your health care provider if you are at risk.  If you do not have HIV, but are at risk, it may be recommended that  you take a prescription medicine daily to prevent HIV infection. This is called pre-exposure prophylaxis (PrEP). You are considered at risk if:  You are sexually active and do not regularly use condoms or know the HIV status of your partner(s).  You take drugs by injection.  You are sexually active with a partner who has HIV. Talk with your health care provider about whether you are at high risk of being infected with HIV. If you choose to begin PrEP, you should first be tested for HIV. You should then be tested every 3 months for as long as you are taking PrEP. Pregnancy  If you are premenopausal and you may become pregnant, ask your health care provider about preconception counseling.  If you may become pregnant, take 400 to 800 micrograms (mcg) of folic acid every day.  If you want to prevent pregnancy, talk to your health care provider about birth control (contraception). Osteoporosis and menopause  Osteoporosis is a disease in which the bones lose minerals and strength with aging. This can result in serious bone fractures. Your risk for osteoporosis can be identified using a bone density scan.  If you are 7 years of age or older, or if you are at risk for osteoporosis and fractures, ask your health care provider if you should be screened.  Ask your health care provider whether you should take a calcium or vitamin D supplement to lower your risk for osteoporosis.  Menopause may have certain physical symptoms and risks.  Hormone replacement therapy may reduce some of  these symptoms and risks. Talk to your health care provider about whether hormone replacement therapy is right for you. Follow these instructions at home:  Schedule regular health, dental, and eye exams.  Stay current with your immunizations.  Do not use any tobacco products including cigarettes, chewing tobacco, or electronic cigarettes.  If you are pregnant, do not drink alcohol.  If you are breastfeeding, limit how much and how often you drink alcohol.  Limit alcohol intake to no more than 1 drink per day for nonpregnant women. One drink equals 12 ounces of beer, 5 ounces of wine, or 1 ounces of hard liquor.  Do not use street drugs.  Do not share needles.  Ask your health care provider for help if you need support or information about quitting drugs.  Tell your health care provider if you often feel depressed.  Tell your health care provider if you have ever been abused or do not feel safe at home. This information is not intended to replace advice given to you by your health care provider. Make sure you discuss any questions you have with your health care provider. Document Released: 12/16/2010 Document Revised: 11/08/2015 Document Reviewed: 03/06/2015  2017 [employer]vier

## 2016-10-02 ENCOUNTER — Encounter: Payer: Self-pay | Admitting: Internal Medicine

## 2016-10-02 ENCOUNTER — Encounter: Payer: Self-pay | Admitting: Emergency Medicine

## 2016-10-02 ENCOUNTER — Ambulatory Visit (INDEPENDENT_AMBULATORY_CARE_PROVIDER_SITE_OTHER): Payer: BLUE CROSS/BLUE SHIELD | Admitting: Internal Medicine

## 2016-10-02 VITALS — BP 98/60 | HR 68 | Temp 98.4°F | Ht 63.25 in | Wt 125.8 lb

## 2016-10-02 DIAGNOSIS — R197 Diarrhea, unspecified: Secondary | ICD-10-CM | POA: Diagnosis not present

## 2016-10-02 DIAGNOSIS — K529 Noninfective gastroenteritis and colitis, unspecified: Secondary | ICD-10-CM | POA: Diagnosis not present

## 2016-10-02 MED ORDER — AZITHROMYCIN 500 MG PO TABS: 500.0000 mg | ORAL_TABLET | Freq: Every day | ORAL | 0 refills | Status: DC

## 2016-10-02 NOTE — Patient Instructions (Addendum)
I agree that this sounds like colitis infection. Not all causes get better with antibiotics but because you have the blood would check stool tests and add antibiotic. Stay hydrated should improve in the next 48-72 hours either way. If high fever cant  keep fluids down seek emergent care. Plan follow-up depending on how you were doing next week. Note for work today and tomorrow.

## 2016-10-02 NOTE — Progress Notes (Signed)
Chief Complaint  Patient presents with  . Abdominal Cramping    started last night  . Emesis  . Rectal Bleeding    diarrhea    HPI: Kathleen Hayes 26 y.o.  sda   For gi sx acute  Was in good health until 2 AM when she awoke with lower abdominal cramping and then developed vomiting 1 and frequent watery loose diarrhea watery to semi-formed. At about 4 AM she noticed blood with the diarrhea that she describes as streaks of blood surrounding the stool. Not related to the rectal area. She's had about 4 stools the last 4 hours. She had a history of a similar episode worse about 2 years ago when she was on campus and it was felt that she could had Salmonella was seen in the emergency room and treated but no stool test was ever been able to culture. Was felt to be from undercooked chicken. She did eat a [place] restaurant last night no one else got sick. The day before was her boyfriend's father Adriana Simas due to condition of crockpot that she felt was not undercooked. No exposure to children who are ill. May need a note for work doesn't prepare food. [place]n [place] Others illn meds tried n Hydration taking water this am with ginger ale  ROS: See pertinent positives and negatives per HPI.  Past Medical History:  Diagnosis Date  . Acne   . Anxiety    and pa nic; [facility] consult 2011  . Concussion   . HEAD TRAUMA, CLOSED 06/08/2007   Qualifier: Diagnosis of  By: Fabian Sharp MD, Neta Mends   . History of chlamydia 12/28/2012  . Shingles     Family History  Problem Relation Age of Onset  . Mitral valve prolapse Father   . Hypertension Father   . Cancer Maternal Grandfather     PANCREATIC  . Cancer Paternal Grandfather     BRAIN TUMOR  . Hypertension Paternal Grandfather     Social History   Social History  . Marital status: Single    Spouse name: N/A  . Number of children: N/A  . Years of education: N/A   Social History Main Topics  . Smoking status: Never Smoker  .  Smokeless tobacco: Never Used  . Alcohol use 0.0 oz/week     Comment: Rare  . Drug use: No  . Sexual activity: Yes    Birth control/ protection: Pill     Comment: 1st intercourse 26 yo-Fewer than 5 partners   Other Topics Concern  . 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 ALPRAZolam (XANAX) 1 MG tablet Take 1 mg by mouth daily as needed.  0  . amphetamine-dextroamphetamine (ADDERALL XR) 15 MG 24 hr capsule Take 15 mg by mouth every morning.    . cetirizine (ZYRTEC) 10 MG tablet Take 10 mg by mouth daily.    . citalopram (CELEXA) 40  MG tablet Take 40 mg by mouth daily.  0  . norgestimate-ethinyl estradiol (SPRINTEC 28) 0.25-35 MG-MCG tablet Take 1 tablet by mouth daily. 28 tablet 11   No facility-administered medications prior to visit.      EXAM:  BP 98/60 (BP Location: Right Arm, Patient Position: Sitting, Cuff Size: Normal)   Pulse 68   Temp 98.4 F (36.9 C) (Oral)   Ht 5' 3.25" (1.607 m)   Wt 125 lb 12.8 oz (57.1 kg)   LMP 09/08/2016 (Approximate)   BMI 22.11 kg/m   Body mass index is 22.11 kg/m.  GENERAL: vitals reviewed and listed above, alert, oriented, appears well hydrated and in no acute distressAppears nontoxic mildly ill normal skin tone. HEENT: atraumatic, conjunctiva  clear, no obvious abnormalities on inspection of external nose and ears OP : no lesion edema or exudate moist mucous membranes NECK: no obvious masses on inspection palpation  LUNGS: clear to auscultation bilaterally, no wheezes, rales or rhonchi, good air  movement CV: HRRR, no clubbing cyanosis or  peripheral edema nl cap refill  Abdomen:  Sof,t  Ave l bowel sounds without hepatosplenomegaly, no guarding rebound or masses no CVA tenderness MS: moves all extremities without noticeable focal  abnormality   ASSESSMENT AND PLAN:  Discussed the following assessment and plan:  Bloody diarrhea - Plan: Stool culture, Fecal lactoferrin, quant, E Coli Shiga Toxin, EIA, C. difficile GDH and Toxin A/B  Gastroenteritis - Plan: Stool culture, Fecal lactoferrin, quant, E Coli Shiga Toxin, EIA, C. difficile GDH and Toxin A/B No recent antibiotics check stool tests because of bloody diarrhea and severity although no fever okay to do empiric treatment azithromycin 500 mg a day for 3 days feels that she could tolerate the medication. Expectant management for work. Plan further work up eval if  persistent or progressive  -Patient advised to return or notify health care team  if symptoms worsen ,persist or new concerns arise.  Patient Instructions  I agree that this sounds like colitis infection. Not all causes get better with antibiotics but because you have the blood would check stool tests and add antibiotic. Stay hydrated should improve in the next 48-72 hours either way. If high fever cant  keep fluids down seek emergent care. Plan follow-up depending on how you were doing next week. Note for work today and tomorrow.       Neta Mends. Panosh M.D.

## 2016-10-07 NOTE — Addendum Note (Signed)
Addended by: Bonnye Fava on: 10/07/2016 12:41 PM   Modules accepted: Orders

## 2016-10-09 LAB — E COLI SHIGA TOXIN EIA: E COLI SHIGA TOXIN ASSAY: NEGATIVE

## 2016-10-29 ENCOUNTER — Encounter: Payer: Self-pay | Admitting: Gynecology

## 2017-02-11 ENCOUNTER — Other Ambulatory Visit: Payer: Self-pay

## 2017-02-11 MED ORDER — NORGESTIMATE-ETH ESTRADIOL 0.25-35 MG-MCG PO TABS: 1.0000 | ORAL_TABLET | Freq: Every day | ORAL | 2 refills | Status: DC

## 2017-03-06 ENCOUNTER — Encounter: Payer: Self-pay | Admitting: Internal Medicine

## 2017-05-04 ENCOUNTER — Other Ambulatory Visit: Payer: Self-pay

## 2017-05-13 ENCOUNTER — Other Ambulatory Visit: Payer: Self-pay | Admitting: Gynecology

## 2017-05-13 ENCOUNTER — Other Ambulatory Visit: Payer: Self-pay

## 2017-05-15 ENCOUNTER — Ambulatory Visit (INDEPENDENT_AMBULATORY_CARE_PROVIDER_SITE_OTHER): Payer: BLUE CROSS/BLUE SHIELD | Admitting: Family Medicine

## 2017-05-15 ENCOUNTER — Encounter: Payer: Self-pay | Admitting: Family Medicine

## 2017-05-15 VITALS — BP 100/70 | HR 69 | Temp 98.2°F | Ht 63.25 in | Wt 122.5 lb

## 2017-05-15 DIAGNOSIS — H00012 Hordeolum externum right lower eyelid: Secondary | ICD-10-CM

## 2017-05-15 DIAGNOSIS — Z0000 Encounter for general adult medical examination without abnormal findings: Secondary | ICD-10-CM | POA: Diagnosis not present

## 2017-05-15 LAB — BASIC METABOLIC PANEL
BUN: 8 mg/dL (ref 6–23)
CHLORIDE: 106 meq/L (ref 96–112)
CO2: 28 meq/L (ref 19–32)
Calcium: 9.6 mg/dL (ref 8.4–10.5)
Creatinine, Ser: 0.66 mg/dL (ref 0.40–1.20)
GFR: 116.22 mL/min (ref >60.00)
GLUCOSE: 89 mg/dL (ref 70–99)
POTASSIUM: 4 meq/L (ref 3.5–5.1)
SODIUM: 140 meq/L (ref 135–145)

## 2017-05-15 LAB — CBC WITH DIFFERENTIAL/PLATELET
BASOS ABS: 0 10*3/uL (ref 0.0–0.1)
BASOS PCT: 0.4 % (ref 0.0–3.0)
EOS ABS: 0.1 10*3/uL (ref 0.0–0.7)
Eosinophils Relative: 2.4 % (ref 0.0–5.0)
HEMATOCRIT: 40.1 % (ref 36.0–46.0)
HEMOGLOBIN: 13.4 g/dL (ref 12.0–15.0)
LYMPHS PCT: 38.8 % (ref 12.0–46.0)
Lymphs Abs: 2.1 10*3/uL (ref 0.7–4.0)
MCHC: 33.3 g/dL (ref 30.0–36.0)
MCV: 93.7 fl (ref 78.0–100.0)
MONO ABS: 0.5 10*3/uL (ref 0.1–1.0)
Monocytes Relative: 9.3 % (ref 3.0–12.0)
Neutro Abs: 2.6 10*3/uL (ref 1.4–7.7)
Neutrophils Relative %: 49.1 % (ref 43.0–77.0)
Platelets: 220 10*3/uL (ref 150.0–400.0)
RBC: 4.28 Mil/uL (ref 3.87–5.11)
RDW: 12.2 % (ref 11.5–15.5)
WBC: 5.3 10*3/uL (ref 4.0–10.5)

## 2017-05-15 NOTE — Patient Instructions (Addendum)
Preventive Care 18-39 Years, Female Preventive care refers to lifestyle choices and visits with your health care provider that can promote health and wellness. What does preventive care include?  A yearly physical exam. This is also called an annual well check.  Dental exams once or twice a year.  Routine eye exams. Ask your health care provider how often you should have your eyes checked.  Personal lifestyle choices, including: ? Daily care of your teeth and gums. ? Regular physical activity. ? Eating a healthy diet. ? Avoiding tobacco and drug use. ? Limiting alcohol use. ? Practicing safe sex. ? Taking vitamin and mineral supplements as recommended by your health care provider. What happens during an annual well check? The services and screenings done by your health care provider during your annual well check will depend on your age, overall health, lifestyle risk factors, and family history of disease. Counseling Your health care provider may ask you questions about your:  Alcohol use.  Tobacco use.  Drug use.  Emotional well-being.  Home and relationship well-being.  Sexual activity.  Eating habits.  Work and work Statistician.  Method of birth control.  Menstrual cycle.  Pregnancy history.  Screening You may have the following tests or measurements:  Height, weight, and BMI.  Diabetes screening. This is done by checking your blood sugar (glucose) after you have not eaten for a while (fasting).  Blood pressure.  Lipid and cholesterol levels. These may be checked every 5 years starting at age 66.  Skin check.  Hepatitis C blood test.  Hepatitis B blood test.  Sexually transmitted disease (STD) testing.  BRCA-related cancer screening. This may be done if you have a family history of breast, ovarian, tubal, or peritoneal cancers.  Pelvic exam and Pap test. This may be done every 3 years starting at age 40. Starting at age 59, this may be done every 5  years if you have a Pap test in combination with an HPV test.  Discuss your test results, treatment options, and if necessary, the need for more tests with your health care provider. Vaccines Your health care provider may recommend certain vaccines, such as:  Influenza vaccine. This is recommended every year.  Tetanus, diphtheria, and acellular pertussis (Tdap, Td) vaccine. You may need a Td booster every 10 years.  Varicella vaccine. You may need this if you have not been vaccinated.  HPV vaccine. If you are 69 or younger, you may need three doses over 6 months.  Measles, mumps, and rubella (MMR) vaccine. You may need at least one dose of MMR. You may also need a second dose.  Pneumococcal 13-valent conjugate (PCV13) vaccine. You may need this if you have certain conditions and were not previously vaccinated.  Pneumococcal polysaccharide (PPSV23) vaccine. You may need one or two doses if you smoke cigarettes or if you have certain conditions.  Meningococcal vaccine. One dose is recommended if you are age 27-21 years and a first-year college student living in a residence hall, or if you have one of several medical conditions. You may also need additional booster doses.  Hepatitis A vaccine. You may need this if you have certain conditions or if you travel or work in places where you may be exposed to hepatitis A.  Hepatitis B vaccine. You may need this if you have certain conditions or if you travel or work in places where you may be exposed to hepatitis B.  Haemophilus influenzae type b (Hib) vaccine. You may need this if  you have certain risk factors.  Talk to your health care provider about which screenings and vaccines you need and how often you need them. This information is not intended to replace advice given to you by your health care provider. Make sure you discuss any questions you have with your health care provider. Document Released: 07/29/2001 Document Revised: 02/20/2016  Document Reviewed: 04/03/2015 Elsevier Interactive Patient Education  2017 [place] is a bump on your eyelid caused by a bacterial infection. A stye can form inside the eyelid (internal stye) or outside the eyelid (external stye). An internal stye may be caused by an infected oil-producing gland inside your eyelid. An external stye may be caused by an infection at the base of your eyelash (hair follicle). Styes are very common. Anyone can get them at any age. They usually occur in just one eye, but you may have more than one in either eye. What are the causes? The infection is almost always caused by bacteria called Staphylococcus aureus. This is a common type of bacteria that lives on your skin. What increases the risk? You may be at higher risk for a stye if you have had one before. You may also be at higher risk if you have:  Diabetes.  Long-term illness.  Long-term eye redness.  A skin condition called seborrhea.  High fat levels in your blood (lipids).  What are the signs or symptoms? Eyelid pain is the most common symptom of a stye. Internal styes are more painful than external styes. Other signs and symptoms may include:  Painful swelling of your eyelid.  A scratchy feeling in your eye.  Tearing and redness of your eye.  Pus draining from the stye.  How is this diagnosed? Your health care provider may be able to diagnose a stye just by examining your eye. The health care provider may also check to make sure:  You do not have a fever or other signs of a more serious infection.  The infection has not spread to other parts of your eye or areas around your eye.  How is this treated? Most styes will clear up in a few days without treatment. In some cases, you may need to use antibiotic drops or ointment to prevent infection. Your health care provider may have to drain the stye surgically if your stye is:  Large.  Causing a lot of pain.  Interfering  with your vision.  This can be done using a thin blade or a needle. Follow these instructions at home:  Take medicines only as directed by your health care provider.  Apply a clean, warm compress to your eye for 10 minutes, 4 times a day.  Do not wear contact lenses or eye makeup until your stye has healed.  Do not try to pop or drain the stye. Contact a health care provider if:  You have chills or a fever.  Your stye does not go away after several days.  Your stye affects your vision.  Your eyeball becomes swollen, red, or painful. This information is not intended to replace advice given to you by your health care provider. Make sure you discuss any questions you have with your health care provider. Document Released: 03/12/2005 Document Revised: 01/27/2016 Document Reviewed: 09/16/2013 Elsevier Interactive Patient Education  [employer].

## 2017-05-15 NOTE — Progress Notes (Signed)
Subjective:     Kathleen Hayes is an 26 y.o. female who presents for physical exam.  Pt is also followed by Dr. Lily PeerFernandez, OB/Gyn.   She denies any current medical problems. Immunizations are up to date. Pt declines influenza vaccine.  Of note expresses concern for small rash under R eye x 2 wks.  Sore to the touch, non pruritic.  Pt has not tried anything for the rash.  Pt is working at a [employer] [place]  The following portions of the patient's history were reviewed and updated as appropriate: allergies, current medications, past family history, past medical history, past social history, past surgical history and problem list.  Review of Systems A comprehensive review of systems was negative.    With the exception of Rash underneath R eye.   Objective:    BP 100/70 (BP Location: Right Arm, Patient Position: Sitting, Cuff Size: Normal)   Pulse 69   Temp 98.2 F (36.8 C) (Oral)   Ht 5' 3.25" (1.607 m)   Wt 122 lb 8 oz (55.6 kg)   BMI 21.53 kg/m    General Appearance:    Alert, cooperative, no distress, appears stated age  Head:    Normocephalic, without obvious abnormality, atraumatic  Eyes:    PERRL, conjunctiva/corneas clear, EOM's intact both eyes  Ears:    Normal TM's and external ear canals, both ears  Nose:   Nares normal, septum midline, mucosa normal, no drainage    or sinus tenderness  Throat:   Lips, mucosa, and tongue normal; teeth and gums normal  Neck:   Supple, symmetrical, trachea midline, no adenopathy;    thyroid:  no enlargement/tenderness/nodules; no JVD  Back:     Symmetric, no curvature, ROM normal  Lungs:     Clear to auscultation bilaterally, respirations unlabored   Heart:    Regular rate and rhythm, S1 and S2 normal, no murmur, rub   or gallop  Breast Exam:    Deferred  Abdomen:     Soft, non-tender, bowel sounds active all four quadrants,    no masses, no organomegaly  Genitalia:    Deferred, pt seen by OB/Gyn  Extremities:   Extremities normal,  atraumatic, no cyanosis or edema  Pulses:   2+ and symmetric all extremities  Skin:   Skin color, texture, turgor normal, no rashes or lesions  Neurologic:   CNII-XII intact, normal strength, and sensation throughout      Assessment:    Satisfactory physical exam with abnormal finding of Sty.      Plan:    1. Reviewed health maintenance, contraception, STDs, and substance abuse. 2. Offered STI testing, pt declined. 3. Follow up will be as needed.   Pt to have pap with OB/Gyn.

## 2017-05-23 ENCOUNTER — Other Ambulatory Visit: Payer: Self-pay | Admitting: Gynecology

## 2017-06-24 ENCOUNTER — Ambulatory Visit: Payer: BLUE CROSS/BLUE SHIELD | Admitting: Internal Medicine

## 2017-06-24 ENCOUNTER — Encounter: Payer: Self-pay | Admitting: Internal Medicine

## 2017-06-24 ENCOUNTER — Encounter: Payer: BLUE CROSS/BLUE SHIELD | Admitting: Women's Health

## 2017-06-24 VITALS — BP 102/62 | HR 84 | Temp 98.4°F | Wt 121.8 lb

## 2017-06-24 DIAGNOSIS — Z8709 Personal history of other diseases of the respiratory system: Secondary | ICD-10-CM | POA: Diagnosis not present

## 2017-06-24 DIAGNOSIS — J351 Hypertrophy of tonsils: Secondary | ICD-10-CM

## 2017-06-24 DIAGNOSIS — J029 Acute pharyngitis, unspecified: Secondary | ICD-10-CM | POA: Diagnosis not present

## 2017-06-24 LAB — POCT RAPID STREP A (OFFICE): RAPID STREP A SCREEN: NEGATIVE

## 2017-06-24 MED ORDER — AMOXICILLIN 500 MG PO CAPS: 500.0000 mg | ORAL_CAPSULE | Freq: Two times a day (BID) | ORAL | 0 refills | Status: DC

## 2017-06-24 NOTE — Patient Instructions (Addendum)
This may be viral infection that runs its course  But if strep is positive or  The tonsils continue enlarged and swollen glands then can take antibiotic   empirically for tonsillitis

## 2017-06-24 NOTE — Progress Notes (Signed)
Chief Complaint  Patient presents with  . Sore Throat    sore throat, body aches, runny nose and little head congestion.. Denies cough and fever but is waking at night drenched in sweat.     HPI: Kathleen GibbonsJennifer Hayes 27 y.o.  SDA   Body aches   And sweating    .   Congestion  Some not too much . But  Bad sore throat  X 3 days  Hx of tonsillitis in past  No serious one for 1-2 years .   No cough . No illness at home  No flu shot.    ROS: See pertinent positives and negatives per HPI. No v d rash   Past Medical History:  Diagnosis Date  . Acne   . Anxiety    and pa nic; [facility] consult 2011  . Concussion   . HEAD TRAUMA, CLOSED 06/08/2007   Qualifier: Diagnosis of  By: Fabian SharpPanosh MD, Neta MendsWanda K   . History of chlamydia 12/28/2012  . Shingles     Family History  Problem Relation Age of Onset  . Mitral valve prolapse Father   . Hypertension Father   . Cancer Maternal Grandfather        PANCREATIC  . Cancer Paternal Grandfather        BRAIN TUMOR  . Hypertension Paternal Grandfather     Social History   Socioeconomic History  . Marital status: Single    Spouse name: None  . Number of children: None  . Years of education: None  . Highest education level: None  Social Needs  . Financial resource strain: None  . Food insecurity - worry: None  . Food insecurity - inability: None  . Transportation needs - medical: None  . Transportation needs - non-medical: None  Occupational History  . None  Tobacco Use  . Smoking status: Never Smoker  . Smokeless tobacco: Never Used  Substance and Sexual Activity  . Alcohol use: Yes    Alcohol/week: 0.0 oz    Comment: Rare  . Drug use: No  . Sexual activity: Yes    Birth control/protection: Pill    Comment: 1st intercourse 27 yo-Fewer than 5 partners  Other Topics Concern  . 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 cetirizine (ZYRTEC) 10 MG tablet Take 10 mg by mouth daily.    . citalopram (CELEXA) 40 MG tablet Take 40 mg by mouth daily.  0  . SPRINTEC 28 0.25-35 MG-MCG tablet TAKE 1 TABLET BY MOUTH DAILY 28 tablet 0  . SPRINTEC 28 0.25-35 MG-MCG tablet TAKE 1 TABLET BY MOUTH DAILY (Patient not taking: Reported on 06/24/2017) 28 tablet 2   No facility-administered medications prior to visit.      EXAM:  BP 102/62 (BP Location: Right Arm, Patient Position: Sitting, Cuff Size: Normal)   Pulse 84   Temp 98.4 F (36.9 C) (Oral)   Wt  121 lb 12.8 oz (55.2 kg)   BMI 21.41 kg/m   Body mass index is 21.41 kg/m.  GENERAL: vitals reviewed and listed above, alert, oriented, appears well hydrated and in no acute distress non toxic  Mild nose congestion  HEENT: atraumatic, conjunctiva  clear, no obvious abnormalities on inspection of external nose and earstms clear  OP : left  Tonsil 2+ and right 1+  No exudate  Red1+   NECK: no obvious masses on inspection  ledt 1+ tender ac node  No  pc nodes   LUNGS: clear to auscultation bilaterally, no wheezes, rales or rhonchi, CV: HRRR, no clubbing cyanosis or  peripheral edema nl cap refill  MS: moves all extremities without noticeable focal  abnormality PSYCH: pleasant and cooperative, no obvious depression or anxiety  ASSESSMENT AND PLAN:  Discussed the following assessment and plan:  Pharyngitis, unspecified etiology - Plan: Culture, Group A Strep, POCT rapid strep A  Hx of tonsillitis - Plan: Culture, Group A Strep, POCT rapid strep A  Enlarged tonsils - Plan: Culture, Group A Strep, POCT rapid strep A This illness may well be viral however  she does have a history of underlying recurrent tonsillitis and deep cryptic tonsils.  If continuing localizing symptoms with adenopathy especially on the left can add antibiotic.  Otherwise observe and symptomatic treatment. -Patient advised to return or notify health care team  if symptoms worsen ,persist or new concerns arise.  Patient Instructions  This may be viral infection that runs its course  But if strep is positive or  The tonsils continue enlarged and swollen glands then can take antibiotic   empirically for tonsillitis      Burna Mortimer K. Riel Hirschman M.D.

## 2017-06-26 LAB — CULTURE, GROUP A STREP
MICRO NUMBER:: 90034811
SPECIMEN QUALITY:: ADEQUATE

## 2017-07-06 ENCOUNTER — Encounter: Payer: BLUE CROSS/BLUE SHIELD | Admitting: Women's Health

## 2017-07-06 DIAGNOSIS — Z0289 Encounter for other administrative examinations: Secondary | ICD-10-CM

## 2017-08-15 ENCOUNTER — Other Ambulatory Visit: Payer: Self-pay | Admitting: Gynecology

## 2017-08-18 ENCOUNTER — Telehealth: Payer: Self-pay | Admitting: *Deleted

## 2017-08-18 MED ORDER — NORGESTIMATE-ETH ESTRADIOL 0.25-35 MG-MCG PO TABS: 1.0000 | ORAL_TABLET | Freq: Every day | ORAL | 0 refills | Status: DC

## 2017-08-18 NOTE — Telephone Encounter (Signed)
Pt annual scheduled on 10/27/17, Rx sent.

## 2017-10-27 ENCOUNTER — Encounter: Payer: BLUE CROSS/BLUE SHIELD | Admitting: Women's Health

## 2017-10-27 DIAGNOSIS — Z0289 Encounter for other administrative examinations: Secondary | ICD-10-CM

## 2017-11-02 ENCOUNTER — Other Ambulatory Visit: Payer: Self-pay | Admitting: Women's Health

## 2017-11-06 ENCOUNTER — Other Ambulatory Visit: Payer: Self-pay | Admitting: Women's Health

## 2017-11-10 ENCOUNTER — Other Ambulatory Visit: Payer: Self-pay | Admitting: Women's Health

## 2017-11-24 ENCOUNTER — Ambulatory Visit: Payer: BLUE CROSS/BLUE SHIELD | Admitting: Internal Medicine

## 2017-11-24 ENCOUNTER — Encounter: Payer: Self-pay | Admitting: Internal Medicine

## 2017-11-24 VITALS — BP 114/76 | HR 75 | Temp 97.8°F | Wt 123.4 lb

## 2017-11-24 DIAGNOSIS — R053 Chronic cough: Secondary | ICD-10-CM

## 2017-11-24 DIAGNOSIS — Z8659 Personal history of other mental and behavioral disorders: Secondary | ICD-10-CM | POA: Diagnosis not present

## 2017-11-24 DIAGNOSIS — F411 Generalized anxiety disorder: Secondary | ICD-10-CM

## 2017-11-24 DIAGNOSIS — Z79899 Other long term (current) drug therapy: Secondary | ICD-10-CM | POA: Diagnosis not present

## 2017-11-24 DIAGNOSIS — J3489 Other specified disorders of nose and nasal sinuses: Secondary | ICD-10-CM | POA: Diagnosis not present

## 2017-11-24 DIAGNOSIS — R05 Cough: Secondary | ICD-10-CM

## 2017-11-24 DIAGNOSIS — J351 Hypertrophy of tonsils: Secondary | ICD-10-CM

## 2017-11-24 MED ORDER — AMOXICILLIN 500 MG PO CAPS: 500.0000 mg | ORAL_CAPSULE | Freq: Three times a day (TID) | ORAL | 0 refills | Status: DC

## 2017-11-24 NOTE — Progress Notes (Signed)
Chief Complaint  Patient presents with  . Referral    mental health   . Cough    X1.345months     HPI: Kathleen GibbonsJennifer Hayes 27 y.o. come in for   ABOVE Under rx for a while for  Anxiety and adhd  But now on  celexa   Not on adderall.  For now  ? Asks for reeva of adhd   [place] in [place] and now she is back in [place] so needs local care.  Working [employer]  Cough for a month  Am and night in chest .  Onset likea  Chest cold and not away.  Sometimes  Asthma .  Not production  Night worse.  Face pressure some and lime green drainage at times   Tonsils  The same snore more when uri   Zyrtec and sudafed.  No incs. Currently no cp sob  fever hemoptysis  ROS: See pertinent positives and negatives per HPI.  Past Medical History:  Diagnosis Date  . Acne   . Anxiety    and pa nic; Jenning's consult 2011  . Concussion   . HEAD TRAUMA, CLOSED 06/08/2007   Qualifier: Diagnosis of  By: Fabian SharpPanosh MD, Neta MendsWanda K   . History of chlamydia 12/28/2012  . Shingles     Family History  Problem Relation Age of Onset  . Mitral valve prolapse Father   . Hypertension Father   . Cancer Maternal Grandfather        PANCREATIC  . Cancer Paternal Grandfather        BRAIN TUMOR  . Hypertension Paternal Grandfather     Social History   Socioeconomic History  . Marital status: Single    Spouse name: Not on file  . Number of children: Not on file  . Years of education: Not on file  . Highest education level: Not on file  Occupational History  . Not on file  Social Needs  . Financial resource strain: Not on file  . Food insecurity:    Worry: Not on file    Inability: Not on file  . Transportation needs:    Medical: Not on file    Non-medical: Not on file  Tobacco Use  . Smoking status: Never Smoker  . Smokeless tobacco: Never Used  Substance and Sexual Activity  . Alcohol use: Yes    Alcohol/week: 0.0 oz    Comment: Rare  . Drug use: No  . Sexual activity: Yes    Birth control/protection: Pill      Comment: 1st intercourse 27 yo-Fewer than 5 partners  Lifestyle  . Physical activity:    Days per week: Not on file    Minutes per session: Not on file  . Stress: Not on file  Relationships  . Social connections:    Talks on phone: Not on file    Gets together: Not on file    Attends religious service: Not on file    Active member of club or organization: Not on file    Attends meetings of clubs or organizations: Not on file    Relationship status: Not on file  Other Topics Concern  . 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 cetirizine (ZYRTEC) 10 MG tablet Take 10 mg by mouth daily.    . citalopram (CELEXA) 40 MG tablet Take 40 mg by mouth daily.  0  . norgestimate-ethinyl estradiol (SPRINTEC 28) 0.25-35 MG-MCG tablet Take 1 tablet by mouth daily. 3 Package 0  . amoxicillin (AMOXIL) 500 MG capsule Take 1 capsule (500 mg total) by mouth 2 (two) times daily. 20 capsule 0   No facility-administered medications prior to visit.     EXAM:  BP 114/76 (BP Location: Right Arm, Patient Position: Sitting, Cuff Size: Normal)   Pulse 75   Temp 97.8 F (36.6 C) (Oral)   Wt 123 lb 6.4 oz (56 kg)   LMP 11/15/2017   SpO2 98%   BMI 21.69 kg/m   Body mass index is 21.69 kg/m.  GENERAL: vitals reviewed and listed above, alert, oriented, appears well hydrated and in no acute distress uf congestion  No cough  HEENT: atraumatic, conjunctiva  clear, no obvious abnormalities on inspection of external nose and earstm clear nares 1+ cong face puffy?  OP : no  lesion edema or exudate tonsil 2+ no exudate  NECK: no obvious masses on inspection palpation  LUNGS: clear to auscultation bilaterally, no wheezes, rales or rhonchi, good air movement CV: HRRR, no clubbing cyanosis or  peripheral edema nl cap refill  MS: moves all extremities without noticeable focal  abnormality PSYCH: pleasant and cooperative, no obvious depression or anxiety  BP Readings from Last 3 Encounters:  11/24/17 114/76  06/24/17 102/62  05/15/17 100/70    ASSESSMENT AND PLAN:  Discussed the following assessment and plan:  Cough, persistent  Enlarged tonsils  Sinus drainage  Medication management  Anxiety state - Plan: Ambulatory referral to Psychology  History of ADHD - Plan: Ambulatory referral to Psychology  -Patient advised to return or notify health care team  if  new concerns arise.  Patient Instructions  Add nasal cortisone  Every day for  10 - 14 days and continue if helping for you sinus congestion   Chest is clear today.  If ongoing  Without improving in another week or so then add  Antibiotic     Should be contacted about  reeval for adhd .  Or can call our  behavioral health team also.   Make  Own  appt with psychiatry   Check into networks  .   [facility]  Dr [name] et al   [name] PNP   [facility]       [place]. [name] M.D.

## 2017-11-24 NOTE — Patient Instructions (Addendum)
Add nasal cortisone  Every day for  10 - 14 days and continue if helping for you sinus congestion   Chest is clear today.  If ongoing  Without improving in another week or so then add  Antibiotic     Should be contacted about  reeval for adhd .  Or can call our  behavioral health team also.   Make  Own  appt with psychiatry   Check into networks  .   [facility]  Dr Jennelle Humanottle et al   Deatra RobinsonKaren Jones [facility]   [facility]

## 2017-11-27 ENCOUNTER — Telehealth: Payer: Self-pay | Admitting: *Deleted

## 2017-11-27 ENCOUNTER — Other Ambulatory Visit: Payer: Self-pay | Admitting: Women's Health

## 2017-11-27 MED ORDER — NORGESTIMATE-ETH ESTRADIOL 0.25-35 MG-MCG PO TABS: 1.0000 | ORAL_TABLET | Freq: Every day | ORAL | 0 refills | Status: DC

## 2017-11-27 NOTE — Telephone Encounter (Signed)
Patient has annal exam scheduled on 02/02/18, needs refill on sprintec, Rx sent.

## 2018-01-07 ENCOUNTER — Ambulatory Visit: Payer: Self-pay | Admitting: *Deleted

## 2018-01-07 NOTE — Progress Notes (Deleted)
No chief complaint on file.   HPI: Kathleen Hayes 27 y.o. come in for another episode of rectal bleeding  ROS: See pertinent positives and negatives per HPI.  Past Medical History:  Diagnosis Date  . Acne   . Anxiety    and pa nic; [facility] consult 2011  . Concussion   . HEAD TRAUMA, CLOSED 06/08/2007   Qualifier: Diagnosis of  By: Fabian Sharp MD, Neta Mends   . History of chlamydia 12/28/2012  . Shingles     Family History  Problem Relation Age of Onset  . Mitral valve prolapse Father   . Hypertension Father   . Cancer Maternal Grandfather        PANCREATIC  . Cancer Paternal Grandfather        BRAIN TUMOR  . Hypertension Paternal Grandfather     Social History   Socioeconomic History  . Marital status: Single    Spouse name: Not on file  . Number of children: Not on file  . Years of education: Not on file  . Highest education level: Not on file  Occupational History  . Not on file  Social Needs  . Financial resource strain: Not on file  . Food insecurity:    Worry: Not on file    Inability: Not on file  . Transportation needs:    Medical: Not on file    Non-medical: Not on file  Tobacco Use  . Smoking status: Never Smoker  . Smokeless tobacco: Never Used  Substance and Sexual Activity  . Alcohol use: Yes    Alcohol/week: 0.0 oz    Comment: Rare  . Drug use: No  . Sexual activity: Yes    Birth control/protection: Pill    Comment: 1st intercourse 27 yo-Fewer than 5 partners  Lifestyle  . Physical activity:    Days per week: Not on file    Minutes per session: Not on file  . Stress: Not on file  Relationships  . Social connections:    Talks on phone: Not on file    Gets together: Not on file    Attends religious service: Not on file    Active member of club or organization: Not on file    Attends meetings of clubs or organizations: Not on file    Relationship status: Not on file  Other Topics Concern  . 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 amoxicillin (AMOXIL) 500 MG capsule Take 1 capsule (500 mg total) by mouth 3 (three) times daily. For sinusitis 21 capsule 0  . cetirizine (ZYRTEC) 10 MG tablet Take 10 mg by mouth daily.    . citalopram (CELEXA) 40 MG tablet Take 40 mg by mouth daily.  0  . norgestimate-ethinyl estradiol (SPRINTEC 28) 0.25-35 MG-MCG tablet Take 1 tablet by mouth daily. 3 Package 0   No facility-administered medications prior to visit.  EXAM:  There were no vitals taken for this visit.  There is no height or weight on file to calculate BMI.  GENERAL: vitals reviewed and listed above, alert, oriented, appears well hydrated and in no acute distress HEENT: atraumatic, conjunctiva  clear, no obvious abnormalities on inspection of external nose and ears OP : no lesion edema or exudate  NECK: no obvious masses on inspection palpation  LUNGS: clear to auscultation bilaterally, no wheezes, rales or rhonchi, good air movement CV: HRRR, no clubbing cyanosis or  peripheral edema nl cap refill  MS: moves all extremities without noticeable focal  abnormality PSYCH: pleasant and cooperative, no obvious depression or anxiety Lab Results  Component Value Date   WBC 5.3 05/15/2017   HGB 13.4 05/15/2017   HCT 40.1 05/15/2017   PLT 220.0 05/15/2017   GLUCOSE 89 05/15/2017   CHOL 134 01/12/2014   TRIG 92.0 01/12/2014   HDL 69.00 01/12/2014   LDLCALC 47 01/12/2014   ALT 12 01/12/2014   AST 17 01/12/2014   NA 140 05/15/2017   K 4.0 05/15/2017   CL 106 05/15/2017    CREATININE 0.66 05/15/2017   BUN 8 05/15/2017   CO2 28 05/15/2017   TSH 1.22 01/12/2014   BP Readings from Last 3 Encounters:  11/24/17 114/76  06/24/17 102/62  05/15/17 100/70    ASSESSMENT AND PLAN:  Discussed the following assessment and plan:  No diagnosis found.  -Patient advised to return or notify health care team  if  new concerns arise.  There are no Patient Instructions on file for this visit.   Neta MendsWanda K. Cesar Rogerson M.D.

## 2018-01-07 NOTE — Telephone Encounter (Signed)
Noted  

## 2018-01-07 NOTE — Telephone Encounter (Signed)
Patient is calling to report she had blood in stool this morning. Patient reports some stomach cramping today as well. Patient states she has had blood in stool 3 years ago- and was treated for bacterial infection. Call to office- no acute today- ok for morning appointment with PCP. Patient notified and instructed if she should develope nausea/vomiting, fever or more severe bleeding to go to UC/ED.  Reason for Disposition . MODERATE rectal bleeding (small blood clots, passing blood without stool, or toilet water turns red)  Answer Assessment - Initial Assessment Questions 1. APPEARANCE of BLOOD: "What color is it?" "Is it passed separately, on the surface of the stool, or mixed in with the stool?"      Dark red, mixed in with stool 2. AMOUNT: "How much blood was passed?"     1-2 tablespoon- clotty 3. FREQUENCY: "How many times has blood been passed with the stools?"      1 time so far today 4. ONSET: "When was the blood first seen in the stools?" (Days or weeks)      today 5. DIARRHEA: "Is there also some diarrhea?" If so, ask: "How many diarrhea stools were passed in past 24 hours?"      no 6. CONSTIPATION: "Do you have constipation?" If so, "How bad is it?"     no 7. RECURRENT SYMPTOMS: "Have you had blood in your stools before?" If so, ask: "When was the last time?" and "What happened that time?"      3 years ago- similar- treated for bacteria in stool 8. BLOOD THINNERS: "Do you take any blood thinners?" (e.g., Coumadin/warfarin, Pradaxa/dabigatran, aspirin)     no 9. OTHER SYMPTOMS: "Do you have any other symptoms?"  (e.g., abdominal pain, vomiting, dizziness, fever)     Cramping in stomach- for the past hour 10. PREGNANCY: "Is there any chance you are pregnant?" "When was your last menstrual period?"       No- LMP- 12/29/17  Protocols used: RECTAL BLEEDING-A-AH

## 2018-01-08 ENCOUNTER — Ambulatory Visit: Payer: BLUE CROSS/BLUE SHIELD | Admitting: Internal Medicine

## 2018-02-01 ENCOUNTER — Other Ambulatory Visit: Payer: Self-pay | Admitting: Women's Health

## 2018-02-02 ENCOUNTER — Encounter: Payer: Self-pay | Admitting: Women's Health

## 2018-02-02 ENCOUNTER — Ambulatory Visit (INDEPENDENT_AMBULATORY_CARE_PROVIDER_SITE_OTHER): Payer: BLUE CROSS/BLUE SHIELD | Admitting: Women's Health

## 2018-02-02 VITALS — BP 122/78 | Ht 63.0 in | Wt 129.0 lb

## 2018-02-02 DIAGNOSIS — Z01419 Encounter for gynecological examination (general) (routine) without abnormal findings: Secondary | ICD-10-CM

## 2018-02-02 LAB — CBC WITH DIFFERENTIAL/PLATELET
BASOS PCT: 0.6 %
Basophils Absolute: 32 cells/uL (ref 0–200)
Eosinophils Absolute: 130 cells/uL (ref 15–500)
Eosinophils Relative: 2.4 %
HCT: 38.4 % (ref 35.0–45.0)
HEMOGLOBIN: 13 g/dL (ref 11.7–15.5)
Lymphs Abs: 1242 cells/uL (ref 850–3900)
MCH: 31.6 pg (ref 27.0–33.0)
MCHC: 33.9 g/dL (ref 32.0–36.0)
MCV: 93.4 fL (ref 80.0–100.0)
MONOS PCT: 7.3 %
MPV: 12.4 fL (ref 7.5–12.5)
NEUTROS ABS: 3602 {cells}/uL (ref 1500–7800)
Neutrophils Relative %: 66.7 %
PLATELETS: 207 10*3/uL (ref 140–400)
RBC: 4.11 10*6/uL (ref 3.80–5.10)
RDW: 11.5 % (ref 11.0–15.0)
TOTAL LYMPHOCYTE: 23 %
WBC: 5.4 10*3/uL (ref 3.8–10.8)
WBCMIX: 394 {cells}/uL (ref 200–950)

## 2018-02-02 MED ORDER — NORGESTIMATE-ETH ESTRADIOL 0.25-35 MG-MCG PO TABS: 1.0000 | ORAL_TABLET | Freq: Every day | ORAL | 4 refills | Status: AC

## 2018-02-02 NOTE — Progress Notes (Signed)
Kathleen GibbonsJennifer Hayes 01/23/1991 [id number]    History:    Presents for annual exam.  Monthly Cycle on Sprintec without complaint.  Same partner with negative STD screen.  Normal Pap history.  Gardasil series completed.  Anxiety/depression primary care manages.  Past medical history, past surgical history, family history and social history were all reviewed and documented in the EPIC chart.  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  Parents healthy.  ROS:  A ROS was performed and pertinent positives and negatives are included.  Exam:  Vitals:   02/02/18 1408  BP: 122/78  Weight: 129 lb (58.5 kg)  Height: 5\' 3"  (1.6 m)   Body mass index is 22.85 kg/m.   General appearance:  Normal Thyroid:  Symmetrical, normal in size, without palpable masses or nodularity. Respiratory  Auscultation:  Clear without wheezing or rhonchi Cardiovascular  Auscultation:  Regular rate, without rubs, murmurs or gallops  Edema/varicosities:  Not grossly evident Abdominal  Soft,nontender, without masses, guarding or rebound.  Liver/spleen:  No organomegaly noted  Hernia:  None appreciated  Skin  Inspection:  Grossly normal   Breasts: Examined lying and sitting.     Right: Without masses, retractions, discharge or axillary adenopathy.     Left: Without masses, retractions, discharge or axillary adenopathy. Gentitourinary   Inguinal/mons:  Normal without inguinal adenopathy  External genitalia:  Normal  BUS/Urethra/Skene's glands:  Normal  Vagina:  Normal  Cervix:  Normal  Uterus:   normal in size, shape and contour.  Midline and mobile  Adnexa/parametria:     Rt: Without masses or tenderness.   Lt: Without masses or tenderness.  Anus and perineum: Normal    Assessment/Plan:  27 y.o. S WF G0 for annual exam, no complaints.  Monthly cycle on Sprintec Anxiety depression primary care manages  meds  Plan: Sprintec prescription, proper use given and reviewed slight risk for  blood clots and strokes.  Condoms encouraged until permanent partner.  SBE's, exercise, calcium rich foods, MVI daily encouraged.  CBC, Pap.  Pap normal 2015, new screening guidelines reviewed.    Harrington Challengerancy J Hayes [facility] PensacolaWHNP, 2:29 PM 02/02/2018

## 2018-02-02 NOTE — Telephone Encounter (Signed)
Has appointment 02/02/18.

## 2018-02-02 NOTE — Patient Instructions (Signed)

## 2018-02-02 NOTE — Addendum Note (Signed)
Addended by: Tito DineBONHAM, KIM A on: 02/02/2018 03:30 PM   Modules accepted: Orders

## 2018-02-03 LAB — PAP IG W/ RFLX HPV ASCU

## 2018-02-23 ENCOUNTER — Ambulatory Visit: Payer: BLUE CROSS/BLUE SHIELD | Admitting: Psychology

## 2018-03-01 ENCOUNTER — Other Ambulatory Visit: Payer: Self-pay | Admitting: Women's Health

## 2018-03-01 DIAGNOSIS — N946 Dysmenorrhea, unspecified: Secondary | ICD-10-CM

## 2018-03-02 ENCOUNTER — Ambulatory Visit: Payer: BLUE CROSS/BLUE SHIELD | Admitting: Women's Health

## 2018-03-02 ENCOUNTER — Encounter: Payer: Self-pay | Admitting: Women's Health

## 2018-03-02 ENCOUNTER — Ambulatory Visit (INDEPENDENT_AMBULATORY_CARE_PROVIDER_SITE_OTHER): Payer: BLUE CROSS/BLUE SHIELD

## 2018-03-02 VITALS — BP 110/80

## 2018-03-02 DIAGNOSIS — N946 Dysmenorrhea, unspecified: Secondary | ICD-10-CM

## 2018-03-02 MED ORDER — DROSPIRENONE-ETHINYL ESTRADIOL 3-0.02 MG PO TABS: 1.0000 | ORAL_TABLET | Freq: Every day | ORAL | 4 refills | Status: DC

## 2018-03-02 NOTE — Progress Notes (Signed)
27 year old S WF G0 presents with complaint of increased uterine/pelvic cramping especially with menses, week prior and sometimes after cycle has increased over the past few months.  Has been on Sprintec with regular monthly cycle, history of anxiety and depression.  Denies change in bowel routine, denies urinary symptoms, vaginal discharge, or fever.  States she feels it is more uterine related than GI.  Same partner with negative STD screen.  Exam: Appears well, slightly worried.  Ultrasound: T/V images.  Uterus anteverted homogeneous.  Endometrium within normal limits, 2.9 mm.  Right and left ovaries normal.  A little fluid in cul-de-sac.  No apparent mass right or left adnexal..  Dysmenorrhea/uterine cramping  Plan: Ultrasound results reviewed normality of exam, reassurance given regarding normality of ultrasound.  Options reviewed we will try different pill, has been on Sprintec Yaz prescription, proper use given and reviewed slight risk for blood clots and strokes, will start with next cycle finish a current pack of Sprintec.  Motrin as needed for discomfort.

## 2018-03-03 ENCOUNTER — Telehealth: Payer: Self-pay | Admitting: Internal Medicine

## 2018-03-03 ENCOUNTER — Ambulatory Visit: Payer: Self-pay

## 2018-03-03 ENCOUNTER — Encounter: Payer: Self-pay | Admitting: Internal Medicine

## 2018-03-03 ENCOUNTER — Other Ambulatory Visit (HOSPITAL_COMMUNITY)
Admission: RE | Admit: 2018-03-03 | Discharge: 2018-03-03 | Disposition: A | Discharge status: Home / Self Care | Payer: BLUE CROSS/BLUE SHIELD | Source: Ambulatory Visit | Attending: Internal Medicine | Admitting: Internal Medicine

## 2018-03-03 ENCOUNTER — Ambulatory Visit: Payer: BLUE CROSS/BLUE SHIELD | Admitting: Internal Medicine

## 2018-03-03 VITALS — BP 102/62 | HR 68 | Temp 98.6°F | Wt 129.1 lb

## 2018-03-03 DIAGNOSIS — R1030 Lower abdominal pain, unspecified: Secondary | ICD-10-CM | POA: Diagnosis not present

## 2018-03-03 LAB — POC URINALSYSI DIPSTICK (AUTOMATED)
Blood, UA: NEGATIVE
Glucose, UA: NEGATIVE
Ketones, UA: NEGATIVE
LEUKOCYTES UA: NEGATIVE
Nitrite, UA: NEGATIVE
PROTEIN UA: POSITIVE — AB
Spec Grav, UA: 1.025 (ref 1.010–1.025)
UROBILINOGEN UA: 1 U/dL
pH, UA: 6 (ref 5.0–8.0)

## 2018-03-03 LAB — POCT URINE PREGNANCY: PREG TEST UR: NEGATIVE

## 2018-03-03 MED ORDER — IBUPROFEN 800 MG PO TABS: 800.0000 mg | ORAL_TABLET | Freq: Three times a day (TID) | ORAL | 1 refills | Status: DC | PRN

## 2018-03-03 NOTE — Telephone Encounter (Signed)
Noted  

## 2018-03-03 NOTE — Progress Notes (Signed)
Chief Complaint  Patient presents with  . Abdominal Pain    x 4-5 days. Pt states pain came on slowly then increased in intensity, pain 8-9/10. Pt was seen by OBGYN yesterday and an US was done b/c patient states that the pain felt like severe menstral cramps but the US was clear. Some Nausea and vomiting first 3 days, no vomiting x 2 days, only nausea. Denies fever, diarrhea or constipation. Abdomen is tender when pressing.     HPI: Kathleen Hayes 27 y.o. come in for   SDA   See note from mom and triage  Seen gyne yesterday  Nl us mostly and  Put o n  ocps of dyspmenorrhea  Onset over another couple days.  Last.  Was mostly normal slightly prolonged and this pain began after her period was over.  Cramping she considers pretty severe mostly continuous lower abdomen not associated with urination defecation change in bowel habits but does radiate to her back.  Ibuprofen 400-600 gives minimal relief.  Feels like  Menses but no bleeding ensues. No blood in the stool diarrhea UTI symptoms.  Saw GYN's office yesterday exam apparently was normal as well as ultrasound that showed no result residual cyst a little bit of fluid in the cul-de-sac.  Was placed on OCPs. Denies vaginal discharge risk of pregnancy STI etc. Family history is positive for IBS negative for inflammatory bowel disease.  ROS: See pertinent positives and negatives per HPI.  Past Medical History:  Diagnosis Date  . Concussion   . HEAD TRAUMA, CLOSED 06/08/2007   Qualifier: Diagnosis of  By: Fabian SharpPanosh MD, Neta MendsWanda K   . History of chlamydia 12/28/2012  . Shingles     Family History  Problem Relation Age of Onset  . Mitral valve prolapse Father   . Hypertension Father   . Cancer Maternal Grandfather        PANCREATIC  . Cancer Paternal Grandfather        BRAIN TUMOR  . Hypertension Paternal Grandfather     Social History   Socioeconomic History  . Marital status: Single    Spouse name: Not on file  . Number of  children: Not on file  . Years of education: Not on file  . Highest education level: Not on file  Occupational History  . Not on file  Social Needs  . Financial resource strain: Not on file  . Food insecurity:    Worry: Not on file    Inability: Not on file  . Transportation needs:    Medical: Not on file    Non-medical: Not on file  Tobacco Use  . Smoking status: Never Smoker  . Smokeless tobacco: Never Used  Substance and Sexual Activity  . Alcohol use: Yes    Alcohol/week: 0.0 standard drinks    Comment: Rare  . Drug use: No  . Sexual activity: Yes    Birth control/protection: Pill, Condom    Comment: 1st intercourse 27 yo-Fewer than 5 partners  Lifestyle  . Physical activity:    Days per week: Not on file    Minutes per session: Not on file  . Stress: Not on file  Relationships  . Social connections:    Talks on phone: Not on file    Gets together: Not on file    Attends religious service: Not on file    Active member of club or organization: Not on file    Attends meetings of clubs or organizations: Not on file  Relationship status: Not on file  Other Topics Concern  . 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 buPROPion (WELLBUTRIN SR) 150 MG 12 hr tablet Take 150 mg by mouth daily.  0  . cetirizine (ZYRTEC) 10 MG tablet Take 10 mg by mouth daily.    . citalopram (CELEXA) 40 MG tablet Take 40 mg by mouth daily.  0  . norgestimate-ethinyl estradiol (SPRINTEC 28) 0.25-35 MG-MCG tablet Take 1 tablet by mouth daily. 3 Package 4  .  drospirenone-ethinyl estradiol (YAZ) 3-0.02 MG tablet Take 1 tablet by mouth daily. (Patient not taking: Reported on 03/03/2018) 3 Package 4   No facility-administered medications prior to visit.      EXAM:  BP 102/62 (BP Location: Right Arm, Patient Position: Sitting, Cuff Size: Normal)   Pulse 68   Temp 98.6 F (37 C) (Oral)   Wt 129 lb 1.6 oz (58.6 kg)   LMP 02/20/2018 (Exact Date)   BMI 22.87 kg/m   Body mass index is 22.87 kg/m.  GENERAL: vitals reviewed and listed above, alert, oriented, appears well hydrated and in no acute distress no toxic  Tired  HEENT: atraumatic, conjunctiva  clear, no obvious abnormalities on inspection of external nose and ears OP : no lesion edema or exudate  NECK: no obvious masses on inspection palpation  LUNGS: clear to auscultation bilaterally, no wheezes, rales or rhonchi, good air movement CV: HRRR, no clubbing cyanosis or  peripheral edema nl cap refill  ABD: Soft bowel sounds are present tenderness in the lower abdomen pelvic area more to the left than the right.  No rebound noted no masses hepatosplenomegaly or CVA tenderness. MS: moves all extremities without noticeable focal  abnormality PSYCH: pleasant and cooperative, no obvious depression or anxiety Skin normal turgor no acute rashes no bleeding. Lab Results  Component Value Date   WBC 5.4 02/02/2018   HGB 13.0 02/02/2018   HCT 38.4 02/02/2018   PLT 207 02/02/2018   GLUCOSE 89 05/15/2017   CHOL 134 01/12/2014   TRIG 92.0 01/12/2014   HDL 69.00 01/12/2014   LDLCALC 47 01/12/2014   ALT 12 01/12/2014   AST 17 01/12/2014   NA 140 05/15/2017   K 4.0 05/15/2017   CL 106 05/15/2017   CREATININE 0.66 05/15/2017   BUN 8 05/15/2017   CO2 28 05/15/2017   TSH 1.22 01/12/2014   BP Readings from Last 3 Encounters:  03/03/18 102/62  03/02/18 110/80  02/02/18 122/78    ASSESSMENT AND PLAN:  Discussed the following assessment and plan:  Lower abdominal pain - Plan: CBC with  Differential/Platelet, Sedimentation rate, Celiac Disease Comprehensive Panel with Reflexes, POCT Urinalysis Dipstick (Automated), Urine cytology ancillary only, CMP, POCT urine pregnancy Uncertain cause of her lower abdominal pain feels like menstrual cramps and GYN pain but no explanation suppose endometriosis is possible.  No change in bowel habits that would go with  GI cause however pain is more to the left and there is a family history of IBS.Marland Kitchen Screening labs today prescription strength NSAIDs and go from there.  Stay hydrated meantime note for work. She did have an episode of bloody diarrhea in April 2018 but denies any recurring GI symptoms. -Patient advised to return or notify health care team  if  new concerns arise.  Patient Instructions  Not certain exact cause of pain  consider endometriosis or  Early bowel infection ( doesn't seem  As appendicitis  Etc )    Lab today  Try  naprosyn at high dose    2  - 220 mg aleve   Twice a day  Or Ibuprofen 800 mg every 8 hours in the interim   Then plan follow up .   If ongoing   May need  Gyne and or gi to help but your exam is reassuring at this time for emergency problems       Neta Mends. Taneal Sonntag M.D.

## 2018-03-03 NOTE — Telephone Encounter (Signed)
Patient's mother called in with patient c/o "abdominal pain." The mother says "she went to the GYN yesterday and was told she possibly had a cyst that ruptured because on the ultrasound it shows fluid in the cul-de-sac. She's been taking Tylenol and Ibuprofen, but the pain is not going away. It is debilitating to her, she's not working and she's doubled over." I asked where the pain is located, the patient says "lower across and into my back some. The pain is a 9 and feels like menstrual cramps." I asked about other symptoms, she says "I vomited for 3 days, but nothing now. The pain started last Friday." According to protocol, see PCP within 24 hours, appointment scheduled for today, 03/03/18 at 1500 with Dr. Fabian SharpPanosh, care advice given, patient verbalized understanding.   Reason for Disposition . [1] MODERATE pain (e.g., interferes with normal activities) AND [2] pain comes and goes (cramps) AND [3] present > 24 hours  (Exception: pain with Vomiting or Diarrhea - see that Guideline)  Answer Assessment - Initial Assessment Questions 1. LOCATION: "Where does it hurt?"      All over the the lower abdomen, feels like menstrual cramps 2. RADIATION: "Does the pain shoot anywhere else?" (e.g., chest, back)     Lower back 3. ONSET: "When did the pain begin?" (e.g., minutes, hours or days ago)      Friday night 4. SUDDEN: "Gradual or sudden onset?"     Gradual 5. PATTERN "Does the pain come and go, or is it constant?"    - If constant: "Is it getting better, staying the same, or worsening?"      (Note: Constant means the pain never goes away completely; most serious pain is constant and it progresses)     - If intermittent: "How long does it last?" "Do you have pain now?"     (Note: Intermittent means the pain goes away completely between bouts)     Constant 6. SEVERITY: "How bad is the pain?"  (e.g., Scale 1-10; mild, moderate, or severe)   - MILD (1-3): doesn't interfere with normal activities, abdomen  soft and not tender to touch    - MODERATE (4-7): interferes with normal activities or awakens from sleep, tender to touch    - SEVERE (8-10): excruciating pain, doubled over, unable to do any normal activities      9 7. RECURRENT SYMPTOM: "Have you ever had this type of abdominal pain before?" If so, ask: "When was the last time?" and "What happened that time?"      No 8. CAUSE: "What do you think is causing the abdominal pain?"     I don't know 9. RELIEVING/AGGRAVATING FACTORS: "What makes it better or worse?" (e.g., movement, antacids, bowel movement)     Nothing 10. OTHER SYMPTOMS: "Has there been any vomiting, diarrhea, constipation, or urine problems?"       Vomiting first three days 11. PREGNANCY: "Is there any chance you are pregnant?" "When was your last menstrual period?"       No; LMP 02/20/18  Protocols used: ABDOMINAL PAIN - [facility]

## 2018-03-03 NOTE — Telephone Encounter (Signed)
Copied from CRM [id number]. Topic: General - Other >> Mar 03, 2018  3:56 PM Mcneil, Ja-Kwan wrote: Reason for CRM: Pt called in stating she forgot to get a doctor's note for today's visit. Pt asked if the doctor's note can [facility]. Cb# [phone]

## 2018-03-03 NOTE — Patient Instructions (Addendum)
Not certain exact cause of pain  consider endometriosis or  Early bowel infection ( doesn't seem  As appendicitis  Etc )    Lab today  Try  naprosyn at high dose    2  - 220 mg aleve   Twice a day  Or Ibuprofen 800 mg every 8 hours in the interim   Then plan follow up .   If ongoing   May need  Gyne and or gi to help but your exam is reassuring at this time for emergency problems

## 2018-03-03 NOTE — Telephone Encounter (Signed)
Called patient back, made aware that letter is up front to be picked  Up  Can print from Mychart but will not be signed by MD Only way to get a signed copy if not wanting to pick up is for us to fax it.   Requested a call back to let us know what she decides.

## 2018-03-04 LAB — COMPREHENSIVE METABOLIC PANEL
ALK PHOS: 40 U/L (ref 39–117)
ALT: 12 U/L (ref 0–35)
AST: 14 U/L (ref 0–37)
Albumin: 3.6 g/dL (ref 3.5–5.2)
BILIRUBIN TOTAL: 0.3 mg/dL (ref 0.2–1.2)
BUN: 7 mg/dL (ref 6–23)
CALCIUM: 8.9 mg/dL (ref 8.4–10.5)
CO2: 27 meq/L (ref 19–32)
CREATININE: 0.65 mg/dL (ref 0.40–1.20)
Chloride: 106 mEq/L (ref 96–112)
GFR: 117.52 mL/min (ref >60.00)
GLUCOSE: 95 mg/dL (ref 70–99)
Potassium: 4.3 mEq/L (ref 3.5–5.1)
Sodium: 140 mEq/L (ref 135–145)
TOTAL PROTEIN: 6.3 g/dL (ref 6.0–8.3)

## 2018-03-04 LAB — CBC WITH DIFFERENTIAL/PLATELET
BASOS ABS: 0 10*3/uL (ref 0.0–0.1)
Basophils Relative: 0.4 % (ref 0.0–3.0)
EOS PCT: 1.6 % (ref 0.0–5.0)
Eosinophils Absolute: 0.1 10*3/uL (ref 0.0–0.7)
HEMATOCRIT: 38.4 % (ref 36.0–46.0)
Hemoglobin: 12.9 g/dL (ref 12.0–15.0)
LYMPHS ABS: 1.2 10*3/uL (ref 0.7–4.0)
Lymphocytes Relative: 20.5 % (ref 12.0–46.0)
MCHC: 33.5 g/dL (ref 30.0–36.0)
MCV: 93.7 fl (ref 78.0–100.0)
MONO ABS: 0.5 10*3/uL (ref 0.1–1.0)
MONOS PCT: 9 % (ref 3.0–12.0)
Neutro Abs: 4.2 10*3/uL (ref 1.4–7.7)
Neutrophils Relative %: 68.5 % (ref 43.0–77.0)
PLATELETS: 207 10*3/uL (ref 150.0–400.0)
RBC: 4.1 Mil/uL (ref 3.87–5.11)
RDW: 12.1 % (ref 11.5–15.5)
WBC: 6.1 10*3/uL (ref 4.0–10.5)

## 2018-03-04 LAB — SEDIMENTATION RATE: Sed Rate: 8 mm/hr (ref 0–20)

## 2018-03-04 LAB — CELIAC DISEASE COMPREHENSIVE PANEL WITH REFLEXES
(tTG) Ab, IgA: 1 U/mL
IMMUNOGLOBULIN A: 238 mg/dL (ref 47–310)

## 2018-03-05 LAB — URINE CYTOLOGY ANCILLARY ONLY
CHLAMYDIA, DNA PROBE: POSITIVE — AB
Neisseria Gonorrhea: NEGATIVE

## 2018-03-08 ENCOUNTER — Other Ambulatory Visit: Payer: Self-pay | Admitting: *Deleted

## 2018-03-08 DIAGNOSIS — Z113 Encounter for screening for infections with a predominantly sexual mode of transmission: Secondary | ICD-10-CM

## 2018-03-08 DIAGNOSIS — A749 Chlamydial infection, unspecified: Secondary | ICD-10-CM

## 2018-03-08 MED ORDER — AZITHROMYCIN 500 MG PO TABS: 1000.0000 mg | ORAL_TABLET | Freq: Once | ORAL | 0 refills | Status: AC

## 2018-03-09 NOTE — Telephone Encounter (Signed)
message left to schedule appointment for test to cure chlamydia infection.

## 2018-03-09 NOTE — Telephone Encounter (Signed)
-----   Message from Madelin HeadingsWanda K Panosh, MD sent at 03/05/2018  3:45 PM EDT ----- Forwarding  Results   pland to treat and follow up for her abd pain

## 2018-05-04 ENCOUNTER — Ambulatory Visit (INDEPENDENT_AMBULATORY_CARE_PROVIDER_SITE_OTHER): Payer: BLUE CROSS/BLUE SHIELD | Admitting: Internal Medicine

## 2018-05-04 ENCOUNTER — Encounter: Payer: Self-pay | Admitting: Internal Medicine

## 2018-05-04 VITALS — BP 98/50 | HR 86 | Temp 99.4°F | Wt 126.4 lb

## 2018-05-04 DIAGNOSIS — R05 Cough: Secondary | ICD-10-CM | POA: Diagnosis not present

## 2018-05-04 DIAGNOSIS — R6889 Other general symptoms and signs: Secondary | ICD-10-CM

## 2018-05-04 DIAGNOSIS — R059 Cough, unspecified: Secondary | ICD-10-CM

## 2018-05-04 DIAGNOSIS — J101 Influenza due to other identified influenza virus with other respiratory manifestations: Secondary | ICD-10-CM

## 2018-05-04 DIAGNOSIS — R509 Fever, unspecified: Secondary | ICD-10-CM | POA: Diagnosis not present

## 2018-05-04 LAB — POC INFLUENZA A&B (BINAX/QUICKVUE)
INFLUENZA B, POC: NEGATIVE
Influenza A, POC: POSITIVE — AB

## 2018-05-04 MED ORDER — OSELTAMIVIR PHOSPHATE 75 MG PO CAPS: 75.0000 mg | ORAL_CAPSULE | Freq: Two times a day (BID) | ORAL | 0 refills | Status: DC

## 2018-05-04 NOTE — Patient Instructions (Addendum)
You have influenza  A test positive  Fever can last  2-5 days but if getting worse get back with [place]  No work  Until "fever" gone for 24 hours .     Influenza, Adult Influenza, more commonly known as "the flu," is a viral infection that primarily affects the respiratory tract. The respiratory tract includes organs that help you breathe, such as the lungs, nose, and throat. The flu causes many common cold symptoms, as well as a high fever and body aches. The flu spreads easily from person to person (is contagious). Getting a flu shot (influenza vaccination) every year is the best way to prevent influenza. What are the causes? Influenza is caused by a virus. You can catch the virus by:  Breathing in droplets from an infected person's cough or sneeze.  Touching something that was recently contaminated with the virus and then touching your mouth, nose, or eyes.  What increases the risk? The following factors may make you more likely to get the flu:  Not cleaning your hands frequently with soap and water or alcohol-based hand sanitizer.  Having close contact with many people during cold and flu season.  Touching your mouth, eyes, or nose without washing or sanitizing your hands first.  Not drinking enough fluids or not eating a healthy diet.  Not getting enough sleep or exercise.  Being under a high amount of stress.  Not getting a yearly (annual) flu shot.  You may be at a higher risk of complications from the flu, such as a severe lung infection (pneumonia), if you:  Are over the age of 27.  Are pregnant.  Have a weakened disease-fighting system (immune system). You may have a weakened immune system if you: ? Have HIV or AIDS. ? Are undergoing chemotherapy. ? Aretaking medicines that reduce the activity of (suppress) the immune system.  Have a long-term (chronic) illness, such as heart disease, kidney disease, diabetes, or lung disease.  Have a liver disorder.  Are  obese.  Have anemia.  What are the signs or symptoms? Symptoms of this condition typically last 4-10 days and may include:  Fever.  Chills.  Headache, body aches, or muscle aches.  Sore throat.  Cough.  Runny or congested nose.  Chest discomfort and cough.  Poor appetite.  Weakness or tiredness (fatigue).  Dizziness.  Nausea or vomiting.  How is this diagnosed? This condition may be diagnosed based on your medical history and a physical exam. Your health care provider may do a nose or throat swab test to confirm the diagnosis. How is this treated? If influenza is detected early, you can be treated with antiviral medicine that can reduce the length of your illness and the severity of your symptoms. This medicine may be given by mouth (orally) or through an IV tube that is inserted in one of your veins. The goal of treatment is to relieve symptoms by taking care of yourself at home. This may include taking over-the-counter medicines, drinking plenty of fluids, and adding humidity to the air in your home. In some cases, influenza goes away on its own. Severe influenza or complications from influenza may be treated in a hospital. Follow these instructions at home:  Take over-the-counter and prescription medicines only as told by your health care provider.  Use a cool mist humidifier to add humidity to the air in your home. This can make breathing easier.  Rest as needed.  Drink enough fluid to keep your urine clear or pale  yellow.  Cover your mouth and nose when you cough or sneeze.  Wash your hands with soap and water often, especially after you cough or sneeze. If soap and water are not available, use hand sanitizer.  Stay home from work or school as told by your health care provider. Unless you are visiting your health care provider, try to avoid leaving home until your fever has been gone for 24 hours without the use of medicine.  Keep all follow-up visits as told  by your health care provider. This is important. How is this prevented?  Getting an annual flu shot is the best way to avoid getting the flu. You may get the flu shot in late summer, fall, or winter. Ask your health care provider when you should get your flu shot.  Wash your hands often or use hand sanitizer often.  Avoid contact with people who are sick during cold and flu season.  Eat a healthy diet, drink plenty of fluids, get enough sleep, and exercise regularly. Contact a health care provider if:  You develop new symptoms.  You have: ? Chest pain. ? Diarrhea. ? A fever.  Your cough gets worse.  You produce more mucus.  You feel nauseous or you vomit. Get help right away if:  You develop shortness of breath or difficulty breathing.  Your skin or nails turn a bluish color.  You have severe pain or stiffness in your neck.  You develop a sudden headache or sudden pain in your face or ear.  You cannot stop vomiting. This information is not intended to replace advice given to you by your health care provider. Make sure you discuss any questions you have with your health care provider. Document Released: 05/30/2000 Document Revised: 11/08/2015 Document Reviewed: 03/27/2015 Elsevier Interactive Patient Education  2017 [employer]

## 2018-05-04 NOTE — Progress Notes (Signed)
Chief Complaint  Patient presents with  . Cough    Pt reports having nausea/vomiting x 3days days ago which has since resolved. Pt c/o sneezing, coughing, chills, body aches, night sweats, headache and head congestion x 2 days. Pt has yellow mucous. No noted fever, just low grade temps. Denies sore throat.    HPI: Kathleen GibbonsJennifer Hayes 27 y.o.  Onset  36 hours ago of  Achy sneezing congestion and  Some dry cough at night  No documented fever .   Works  [employer]  Chills  And   Feverish   No flu vaccine .  The worst sx are  the chills . And body  Aches . The gi sx were days before and only lasted 3 hours  And was well in between   ROS: See pertinent positives and negatives per HPI. No sob  Hemoptysis   Past Medical History:  Diagnosis Date  . Concussion   . HEAD TRAUMA, CLOSED 06/08/2007   Qualifier: Diagnosis of  By: Fabian SharpPanosh MD, Neta MendsWanda K   . History of chlamydia 12/28/2012  . Shingles     Family History  Problem Relation Age of Onset  . Mitral valve prolapse Father   . Hypertension Father   . Cancer Maternal Grandfather        PANCREATIC  . Cancer Paternal Grandfather        BRAIN TUMOR  . Hypertension Paternal Grandfather     Social History   Socioeconomic History  . Marital status: Single    Spouse name: Not on file  . Number of children: Not on file  . Years of education: Not on file  . Highest education level: Not on file  Occupational History  . Not on file  Social Needs  . Financial resource strain: Not on file  . Food insecurity:    Worry: Not on file    Inability: Not on file  . Transportation needs:    Medical: Not on file    Non-medical: Not on file  Tobacco Use  . Smoking status: Never Smoker  . Smokeless tobacco: Never Used  Substance and Sexual Activity  . Alcohol use: Yes    Alcohol/week: 0.0 standard drinks    Comment: Rare  . Drug use: No  . Sexual activity: Yes    Birth control/protection: Pill, Condom    Comment: 1st intercourse 27  yo-Fewer than 5 partners  Lifestyle  . Physical activity:    Days per week: Not on file    Minutes per session: Not on file  . Stress: Not on file  Relationships  . Social connections:    Talks on phone: Not on file    Gets together: Not on file    Attends religious service: Not on file    Active member of club or organization: Not on file    Attends meetings of clubs or organizations: Not on file    Relationship status: Not on file  Other Topics Concern  . 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 buPROPion (WELLBUTRIN SR) 150 MG 12 hr tablet Take 150 mg by mouth daily.  0  . cetirizine (ZYRTEC) 10 MG tablet Take 10 mg by mouth daily.    . citalopram (CELEXA) 40 MG tablet Take 40 mg by mouth daily.  0  . norgestimate-ethinyl estradiol (SPRINTEC 28) 0.25-35 MG-MCG tablet Take 1 tablet by mouth daily. 3 Package 4  . drospirenone-ethinyl estradiol (YAZ) 3-0.02 MG tablet Take 1 tablet by mouth daily. (Patient not taking: Reported on 05/04/2018) 3 Package 4  . ibuprofen (ADVIL,MOTRIN) 800 MG tablet Take 1 tablet (800 mg total) by mouth every 8 (eight) hours as needed. For pain (Patient not taking: Reported on 05/04/2018) 30 tablet 1   No facility-administered medications prior to visit.      EXAM:  BP (!) 98/50 (BP Location: Right Arm, Patient Position: Sitting, Cuff Size: Normal)   Pulse 86   Temp 99.4 F (37.4 C) (Oral)   Wt 126 lb 6.4 oz (57.3 kg)   SpO2 97%   BMI 22.39 kg/m   Body mass index is 22.39 kg/m.  GENERAL: vitals reviewed and  listed above, alert, oriented, appears well hydrated and in no acute distress mild congestion non toxic  HEENT: atraumatic, conjunctiva  clear, no obvious abnormalities on inspection of external nose and ears  Tm clear midl sinsu tenderness OP : no lesion edema or exudate  Tonsil 1+ or less  NECK: no obvious masses on inspection palpation  LUNGS: clear to auscultation bilaterally, no wheezes, rales or rhonchi, CV: HRRR, no clubbing cyanosis or  peripheral edema nl cap refill  Abdomen:  Sof,t normal bowel sounds without hepatosplenomegaly, no guarding rebound or masses no CVA tenderness Skin: normal capillary refill ,turgor , color: No acute rashes ,petechiae or bruising Abdomen:  Sof,t normal bowel sounds without hepatosplenomegaly, no guarding rebound or masses no CVA tenderness MS: moves all extremities without noticeable focal  Abnormality Skin: normal capillary refill ,turgor , color: No acute rashes ,petechiae or bruising PSYCH: pleasant and cooperative, poct po influenza A ASSESSMENT AND PLAN:  Discussed the following assessment and plan:  Influenza A  Flu-like symptoms - Plan: POC Influenza A&B (Binax test), CANCELED: POCT Influenza A/B  Cough - Plan: POC Influenza A&B (Binax test)  Fever, unspecified fever cause - Plan: POC Influenza A&B (Binax test)   Expectant management.  Risk benefit of medication discussed.  in window  36 hours  Of onset.  Pt wants to try medicine  . Begin right away.   Fu alarm sx etc  -Patient advised to return or notify health care team  if symptoms worsen ,persist or new concerns arise.  Patient Instructions   You have influenza  A test positive  Fever can last  2-5 days but if getting worse get back with [place]  No work  Until "fever" gone for 24 hours .     Influenza, Adult Influenza, more commonly known as "the flu," is a viral infection that primarily affects the respiratory tract. The respiratory tract includes organs that help you breathe, such  as the lungs, nose, and throat. The flu causes many common cold symptoms, as well as a high fever and body aches. The flu spreads easily from person to person (is contagious). Getting a flu shot (influenza vaccination) every year is the best way to prevent influenza. What are the causes? Influenza is caused by  a virus. You can catch the virus by:  Breathing in droplets from an infected person's cough or sneeze.  Touching something that was recently contaminated with the virus and then touching your mouth, nose, or eyes.  What increases the risk? The following factors may make you more likely to get the flu:  Not cleaning your hands frequently with soap and water or alcohol-based hand sanitizer.  Having close contact with many people during cold and flu season.  Touching your mouth, eyes, or nose without washing or sanitizing your hands first.  Not drinking enough fluids or not eating a healthy diet.  Not getting enough sleep or exercise.  Being under a high amount of stress.  Not getting a yearly (annual) flu shot.  You may be at a higher risk of complications from the flu, such as a severe lung infection (pneumonia), if you:  Are over the age of 50.  Are pregnant.  Have a weakened disease-fighting system (immune system). You may have a weakened immune system if you: ? Have HIV or AIDS. ? Are undergoing chemotherapy. ? Aretaking medicines that reduce the activity of (suppress) the immune system.  Have a long-term (chronic) illness, such as heart disease, kidney disease, diabetes, or lung disease.  Have a liver disorder.  Are obese.  Have anemia.  What are the signs or symptoms? Symptoms of this condition typically last 4-10 days and may include:  Fever.  Chills.  Headache, body aches, or muscle aches.  Sore throat.  Cough.  Runny or congested nose.  Chest discomfort and cough.  Poor appetite.  Weakness or tiredness (fatigue).  Dizziness.  Nausea or  vomiting.  How is this diagnosed? This condition may be diagnosed based on your medical history and a physical exam. Your health care provider may do a nose or throat swab test to confirm the diagnosis. How is this treated? If influenza is detected early, you can be treated with antiviral medicine that can reduce the length of your illness and the severity of your symptoms. This medicine may be given by mouth (orally) or through an IV tube that is inserted in one of your veins. The goal of treatment is to relieve symptoms by taking care of yourself at home. This may include taking over-the-counter medicines, drinking plenty of fluids, and adding humidity to the air in your home. In some cases, influenza goes away on its own. Severe influenza or complications from influenza may be treated in a hospital. Follow these instructions at home:  Take over-the-counter and prescription medicines only as told by your health care provider.  Use a cool mist humidifier to add humidity to the air in your home. This can make breathing easier.  Rest as needed.  Drink enough fluid to keep your urine clear or pale yellow.  Cover your mouth and nose when you cough or sneeze.  Wash your hands with soap and water often, especially after you cough or sneeze. If soap and water are not available, use hand sanitizer.  Stay home from work or school as told by your health care provider. Unless you are visiting your health care provider, try to avoid leaving home until your fever has been gone for 24 hours without the use of medicine.  Keep all follow-up visits as told by your health care provider. This is important. How is this prevented?  Getting an annual flu shot is the best way to avoid getting the flu. You may get the flu shot in late summer,  fall, or winter. Ask your health care provider when you should get your flu shot.  Wash your hands often or use hand sanitizer often.  Avoid contact with people who are  sick during cold and flu season.  Eat a healthy diet, drink plenty of fluids, get enough sleep, and exercise regularly. Contact a health care provider if:  You develop new symptoms.  You have: ? Chest pain. ? Diarrhea. ? A fever.  Your cough gets worse.  You produce more mucus.  You feel nauseous or you vomit. Get help right away if:  You develop shortness of breath or difficulty breathing.  Your skin or nails turn a bluish color.  You have severe pain or stiffness in your neck.  You develop a sudden headache or sudden pain in your face or ear.  You cannot stop vomiting. This information is not intended to replace advice given to you by your health care provider. Make sure you discuss any questions you have with your health care provider. Document Released: 05/30/2000 Document Revised: 11/08/2015 Document Reviewed: 03/27/2015 Elsevier Interactive Patient Education  2017 [employer].     [place]. Jordain Radin M.D.

## 2018-05-25 ENCOUNTER — Ambulatory Visit (INDEPENDENT_AMBULATORY_CARE_PROVIDER_SITE_OTHER): Payer: BLUE CROSS/BLUE SHIELD | Admitting: Psychology

## 2018-05-25 DIAGNOSIS — F909 Attention-deficit hyperactivity disorder, unspecified type: Secondary | ICD-10-CM | POA: Diagnosis not present

## 2018-05-25 DIAGNOSIS — F341 Dysthymic disorder: Secondary | ICD-10-CM

## 2018-05-25 DIAGNOSIS — F1010 Alcohol abuse, uncomplicated: Secondary | ICD-10-CM

## 2018-06-07 ENCOUNTER — Ambulatory Visit (INDEPENDENT_AMBULATORY_CARE_PROVIDER_SITE_OTHER): Payer: BLUE CROSS/BLUE SHIELD | Admitting: Psychology

## 2018-06-07 DIAGNOSIS — F3341 Major depressive disorder, recurrent, in partial remission: Secondary | ICD-10-CM | POA: Diagnosis not present

## 2018-06-07 DIAGNOSIS — F1020 Alcohol dependence, uncomplicated: Secondary | ICD-10-CM | POA: Diagnosis not present

## 2018-06-07 DIAGNOSIS — F1220 Cannabis dependence, uncomplicated: Secondary | ICD-10-CM | POA: Diagnosis not present

## 2018-07-09 ENCOUNTER — Ambulatory Visit (INDEPENDENT_AMBULATORY_CARE_PROVIDER_SITE_OTHER): Payer: BLUE CROSS/BLUE SHIELD | Admitting: Psychology

## 2018-07-09 DIAGNOSIS — F3341 Major depressive disorder, recurrent, in partial remission: Secondary | ICD-10-CM

## 2018-07-09 DIAGNOSIS — F1020 Alcohol dependence, uncomplicated: Secondary | ICD-10-CM | POA: Diagnosis not present

## 2018-07-09 DIAGNOSIS — F1220 Cannabis dependence, uncomplicated: Secondary | ICD-10-CM | POA: Diagnosis not present

## 2018-07-13 ENCOUNTER — Ambulatory Visit (INDEPENDENT_AMBULATORY_CARE_PROVIDER_SITE_OTHER): Payer: BLUE CROSS/BLUE SHIELD | Admitting: Psychology

## 2018-07-13 DIAGNOSIS — F3341 Major depressive disorder, recurrent, in partial remission: Secondary | ICD-10-CM | POA: Diagnosis not present

## 2018-08-06 ENCOUNTER — Ambulatory Visit: Payer: BLUE CROSS/BLUE SHIELD | Admitting: Psychology

## 2018-08-19 ENCOUNTER — Ambulatory Visit: Payer: Self-pay | Admitting: Psychology

## 2018-09-02 ENCOUNTER — Ambulatory Visit: Payer: BLUE CROSS/BLUE SHIELD | Admitting: Psychology

## 2018-09-21 ENCOUNTER — Other Ambulatory Visit: Payer: Self-pay | Admitting: Internal Medicine

## 2018-09-22 ENCOUNTER — Telehealth: Payer: Self-pay

## 2018-09-22 NOTE — Telephone Encounter (Signed)
Left vm for pt to call back need more info on why pt wants a refill on azithromycin

## 2018-09-22 NOTE — Telephone Encounter (Signed)
lvm for pt to call back need more info on why she needs a refill on this

## 2018-10-11 NOTE — Telephone Encounter (Signed)
Left message on machine for patient to return our call CRM 

## 2019-02-07 ENCOUNTER — Encounter: Payer: BLUE CROSS/BLUE SHIELD | Admitting: Women's Health

## 2019-02-07 DIAGNOSIS — Z0289 Encounter for other administrative examinations: Secondary | ICD-10-CM

## 2019-03-07 ENCOUNTER — Encounter: Payer: Self-pay | Admitting: Gynecology

## 2019-03-21 ENCOUNTER — Other Ambulatory Visit: Payer: Self-pay

## 2019-03-23 ENCOUNTER — Other Ambulatory Visit: Payer: Self-pay

## 2019-04-06 ENCOUNTER — Encounter: Payer: Self-pay | Admitting: Internal Medicine

## 2019-05-23 ENCOUNTER — Encounter: Payer: Self-pay | Admitting: Internal Medicine

## 2019-05-23 ENCOUNTER — Telehealth (INDEPENDENT_AMBULATORY_CARE_PROVIDER_SITE_OTHER): Payer: Self-pay | Admitting: Internal Medicine

## 2019-05-23 ENCOUNTER — Other Ambulatory Visit: Payer: Self-pay

## 2019-05-23 DIAGNOSIS — F410 Panic disorder [episodic paroxysmal anxiety] without agoraphobia: Secondary | ICD-10-CM

## 2019-05-23 DIAGNOSIS — F411 Generalized anxiety disorder: Secondary | ICD-10-CM

## 2019-05-23 DIAGNOSIS — Z79899 Other long term (current) drug therapy: Secondary | ICD-10-CM

## 2019-05-23 MED ORDER — CITALOPRAM HYDROBROMIDE 20 MG PO TABS: 20.0000 mg | ORAL_TABLET | Freq: Every day | ORAL | 3 refills | Status: DC

## 2019-05-23 NOTE — Progress Notes (Signed)
Virtual Visit via Video Note  I connected with@ on 05/23/19 at  8:30 AM EST by a video enabled telemedicine application and verified that I am speaking with the correct person using two identifiers. Location patient: home Location provider:work office Persons participating in the virtual visit: patient, provider  [place] national recommendations  regarding COVID 19 pandemic   video visit is advised over in office visit for this patient.  Patient aware  of the limitations of evaluation and management by telemedicine and  availability of in person appointments. and agreed to proceed.   HPI: Kathleen Hayes presents for video visit  Last visit  12 19 for  Influenza   She has [facility] [place] [facility]  [place] 40 mg of citalopram however  Difficulty getting an appt  With psyc h over the alst 6 months  For med refill so  Weaned over about a month  About 2 months ago and was doing ok until the past 3 weeks and having  Panic attacks   Similar to  Past  With sob and anxiety . No other change in health  Is still on ocps sprintec and no problems. 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   No other triggers and not depressed at this time. Feels the same as when she had prev anxiety attacks  Fels that going back on citalopram will be helpful to her  ROS: See pertinent positives and negatives per HPI.  Past Medical History:  Diagnosis Date  . Concussion   . HEAD TRAUMA, CLOSED 06/08/2007   Qualifier: Diagnosis of  By: Fabian Sharp MD, Neta Mends   . History of chlamydia 12/28/2012  . Shingles     Past Surgical History:  Procedure Laterality Date  . ADENOIDECTOMY  age 37  . MYRINGOTOMY  10 months  . WISDOM TOOTH EXTRACTION      Family History  Problem Relation Age of Onset  . Mitral valve prolapse Father   . Hypertension Father   . Cancer Maternal Grandfather        PANCREATIC  . Cancer Paternal Grandfather         BRAIN TUMOR  . Hypertension Paternal Grandfather     Social History   Tobacco Use  . Smoking status: Never Smoker  . Smokeless tobacco: Never Used  Substance Use Topics  . Alcohol use: Yes    Alcohol/week: 0.0 standard drinks    Comment: Rare  . Drug use: No      Current Outpatient Medications:  .  cetirizine (ZYRTEC) 10 MG tablet, Take 10 mg by mouth daily., Disp: , Rfl:  .  citalopram (CELEXA) 20 MG tablet, Take 1 tablet (20 mg total) by mouth daily., Disp: 30 tablet, Rfl: 3 .  norgestimate-ethinyl estradiol (SPRINTEC 28) 0.25-35 MG-MCG tablet, Take 1 tablet by mouth daily., Disp: 3 Package, Rfl: 4  EXAM: BP Readings from Last 3 Encounters:  05/04/18 (!) 98/50  03/03/18 102/62  03/02/18 110/80    VITALS per patient if applicable: looks well and nl  Affect   GENERAL: alert, oriented, appears well and in no acute distress  HEENT: atraumatic, conjunttiva clear, no obvious abnormalities on inspection of external nose and ears  NECK: normal movements of the head and neck  LUNGS: on inspection no signs of respiratory distress, breathing rate appears normal, no obvious gross SOB, gasping or wheezing  CV:  no obvious cyanosis  MS: moves all visible extremities without noticeable abnormality  PSYCH/NEURO: pleasant and cooperative, no obvious depression or anxiety, speech and thought processing grossly intact Lab Results  Component Value Date   WBC 6.1 03/03/2018   HGB 12.9 03/03/2018   HCT 38.4 03/03/2018   PLT 207.0 03/03/2018   GLUCOSE 95 03/03/2018   CHOL 134 01/12/2014   TRIG 92.0 01/12/2014   HDL 69.00 01/12/2014   LDLCALC 47 01/12/2014   ALT 12 03/03/2018   AST 14 03/03/2018   NA 140 03/03/2018   K 4.3 03/03/2018   CL 106 03/03/2018   CREATININE 0.65 03/03/2018   BUN 7 03/03/2018   CO2 27 03/03/2018   TSH 1.22 01/12/2014    ASSESSMENT AND PLAN:  Discussed the following assessment and plan:    ICD-10-CM   1. Anxiety state  F41.1   2.  Generalized anxiety disorder with panic attacks  F41.1    F41.0   3. Medication management  Z79.899    Restart  citalopram  At 20 mg per day and VV in about 3 weeks or as needed   Consider inc dose if needed   . No new issues seem to be underlying  ( albeit covid pandemic) Counseled.   Expectant management and discussion of plan and treatment with opportunity to ask questions and all were answered. The patient agreed with the plan and demonstrated an understanding of the instructions.   Advised to call back or seek an in-person evaluation if worsening  or having  further concerns . Return in about 3 weeks (around 06/13/2019) for med check virtual.  Shanon Ace, MD

## 2019-06-14 ENCOUNTER — Other Ambulatory Visit: Payer: Self-pay | Admitting: Internal Medicine

## 2019-06-14 MED ORDER — CITALOPRAM HYDROBROMIDE 20 MG PO TABS: 20.0000 mg | ORAL_TABLET | Freq: Every day | ORAL | 0 refills | Status: DC

## 2019-06-20 ENCOUNTER — Telehealth (INDEPENDENT_AMBULATORY_CARE_PROVIDER_SITE_OTHER): Payer: Self-pay | Admitting: Internal Medicine

## 2019-06-20 ENCOUNTER — Telehealth: Payer: Self-pay

## 2019-06-20 ENCOUNTER — Other Ambulatory Visit: Payer: Self-pay

## 2019-06-20 ENCOUNTER — Encounter: Payer: Self-pay | Admitting: Internal Medicine

## 2019-06-20 DIAGNOSIS — R202 Paresthesia of skin: Secondary | ICD-10-CM

## 2019-06-20 DIAGNOSIS — Z79899 Other long term (current) drug therapy: Secondary | ICD-10-CM

## 2019-06-20 DIAGNOSIS — F411 Generalized anxiety disorder: Secondary | ICD-10-CM

## 2019-06-20 DIAGNOSIS — J988 Other specified respiratory disorders: Secondary | ICD-10-CM

## 2019-06-20 MED ORDER — ALPRAZOLAM 0.5 MG PO TABS: 0.5000 mg | ORAL_TABLET | Freq: Two times a day (BID) | ORAL | 0 refills | Status: DC | PRN

## 2019-06-20 NOTE — Telephone Encounter (Signed)
Patients mother called in and stated that the patient has seen Dr. Fabian Sharp for anxiety recently and that it has not improved since the visit. Patient stated that "something is just not right"  has symptoms of cough and congestion. Virtual appointment offered with Dr. Clent Ridges since Dr. Fabian Sharp has no openings. Patient stated that she would like to see Dr. Fabian Sharp. Will send to [facility] to see if a slot can be opened

## 2019-06-20 NOTE — Progress Notes (Signed)
Virtual Visit via Video Note  I connected with@ on 06/20/19 at  3:15 PM EST by a video enabled telemedicine application and verified that I am speaking with the correct person using two identifiers. Location patient: home Location provider:work office Persons participating in the virtual visit: patient, provider  [place] national recommendations  regarding COVID 19 pandemic   video visit is advised over in office visit for this patient.  Patient aware  of the limitations of evaluation and management by telemedicine and  availability of in person appointments. and agreed to proceed.   HPI: Kathleen Hayes presents for video visit SDA follow-up medication. She has been on citalopram since her last video visit and seem to have been helping a good bit in regard to her anxiety and panic. However last night after she went to bed she woke after 2 to 3 hours of sleep with an episode of feeling weird tingly in all 4 extremities and vision pulsing off and on without associated pain focal neurologic signs chest pain shortness of breath.  It was unsettling to her lasted about 15 minutes and she went and slept the rest the night in her parents room was able to go back to sleep and couple hours. She has not had this before that woke her from sleep does not remember a nightmare. She has no fever but has had upper respiratory congestion and cough for a number of days the rest of the family as well.  She is not taking any cold medicine no recent alcohol or excess caffeine.   ROS: See pertinent positives and negatives per HPI.  Past Medical History:  Diagnosis Date  . Concussion   . HEAD TRAUMA, CLOSED 06/08/2007   Qualifier: Diagnosis of  By: Regis Bill MD, Standley Brooking   . History of chlamydia 12/28/2012  . Shingles     Past Surgical History:  Procedure Laterality Date  . ADENOIDECTOMY  age 29  . MYRINGOTOMY  10 months  . WISDOM TOOTH EXTRACTION      Family History  Problem Relation Age of Onset  .  Mitral valve prolapse Father   . Hypertension Father   . Cancer Maternal Grandfather        PANCREATIC  . Cancer Paternal Grandfather        BRAIN TUMOR  . Hypertension Paternal Grandfather         Current Outpatient Medications:  .  ALPRAZolam (XANAX) 0.5 MG tablet, Take 1 tablet (0.5 mg total) by mouth 2 (two) times daily as needed for anxiety. attacks, Disp: 10 tablet, Rfl: 0 .  cetirizine (ZYRTEC) 10 MG tablet, Take 10 mg by mouth daily., Disp: , Rfl:  .  citalopram (CELEXA) 20 MG tablet, Take 1 tablet (20 mg total) by mouth daily., Disp: 180 tablet, Rfl: 0 .  norgestimate-ethinyl estradiol (SPRINTEC 28) 0.25-35 MG-MCG tablet, Take 1 tablet by mouth daily., Disp: 3 Package, Rfl: 4  EXAM: BP Readings from Last 3 Encounters:  05/04/18 (!) 98/50  03/03/18 102/62  03/02/18 110/80    VITALS per patient if applicable:  GENERAL: alert, oriented, appears well and in no acute distress  HEENT: atraumatic, conjunttiva clear, no obvious abnormalities on inspection of external nose and ears she has some mild respiratory congestion looks well nontoxic good color normal speech.  NECK: normal movements of the head and neck  LUNGS: on inspection no signs of respiratory distress, breathing rate appears normal, no obvious gross SOB, gasping or wheezing  CV: no obvious cyanosis  MS: moves  all visible extremities without noticeable abnormality  PSYCH/NEURO: pleasant and cooperative, no obvious depression or anxiety, speech and thought processing grossly intact   ASSESSMENT AND PLAN:  Discussed the following assessment and plan:    ICD-10-CM   1. Tingling episode  see text   R20.2    poss anxiety episode  but  from sleep   2. Anxiety state  F41.1   3. Medication management  Z79.899   4. RTI (respiratory tract infection) prob  J98.8    covid screen testing  tomorrow   Symptoms sound a lot like a panic attack however it awoke her from sleep; she does have a early new ongoing  respiratory infection that sounds viral is to be tested for Covid tomorrow discussed avoiding medications that could cause symptoms at this point close observation in order Prescribe #10 alprazolam 0.5 mg that she has had in the remote past when having panic in case needed.  No etoh of cold meds at this time and stay hydrated  At this time does not sound like an acute neurologic event or cardiovascular event. She will keep [place] informed of any new symptoms or ongoing otherwise maintain on the citalopram at this time. Counseled.   Expectant management and discussion of plan and treatment with opportunity to ask questions and all were answered. The patient agreed with the plan and demonstrated an understanding of the instructions.   Advised to call back or seek an in-person evaluation if worsening  or having  further concerns . Return if symptoms worsen or fail to improve, for when planned.   Time based review  And  Counsel and plan  medication 33 minutes   Berniece Andreas, MD

## 2019-06-20 NOTE — Telephone Encounter (Signed)
Pt has been made a virtual appt  

## 2019-06-21 ENCOUNTER — Other Ambulatory Visit: Payer: Self-pay

## 2019-07-01 ENCOUNTER — Ambulatory Visit: Payer: Self-pay | Admitting: Internal Medicine

## 2019-07-08 ENCOUNTER — Other Ambulatory Visit: Payer: Self-pay | Admitting: Internal Medicine

## 2019-07-08 NOTE — Telephone Encounter (Signed)
Last ov:06/20/2019 Last filled:06/20/2019

## 2019-07-21 ENCOUNTER — Other Ambulatory Visit: Payer: Self-pay | Admitting: Internal Medicine

## 2019-07-21 NOTE — Telephone Encounter (Signed)
Last ov:06/20/19 Last filled:07/08/19

## 2019-08-08 ENCOUNTER — Other Ambulatory Visit: Payer: Self-pay | Admitting: Internal Medicine

## 2019-08-08 NOTE — Telephone Encounter (Signed)
Last ov:06/20/19 Last filled:07/21/19

## 2019-08-09 NOTE — Telephone Encounter (Signed)
Please arrange   ROv or virtual fu visit  Needed before  Future refills Will refill todays request  I ninterim

## 2019-08-12 MED ORDER — CITALOPRAM HYDROBROMIDE 20 MG PO TABS: 20.0000 mg | ORAL_TABLET | Freq: Every day | ORAL | 0 refills | Status: DC

## 2019-08-23 NOTE — Telephone Encounter (Signed)
Lvm for pt to call back needs appt for further.

## 2019-08-24 ENCOUNTER — Ambulatory Visit: Payer: Self-pay | Admitting: Internal Medicine

## 2019-08-24 DIAGNOSIS — Z0289 Encounter for other administrative examinations: Secondary | ICD-10-CM

## 2019-08-29 ENCOUNTER — Telehealth (INDEPENDENT_AMBULATORY_CARE_PROVIDER_SITE_OTHER): Payer: Self-pay | Admitting: Internal Medicine

## 2019-08-29 ENCOUNTER — Encounter: Payer: Self-pay | Admitting: Internal Medicine

## 2019-08-29 ENCOUNTER — Other Ambulatory Visit: Payer: Self-pay

## 2019-08-29 DIAGNOSIS — F411 Generalized anxiety disorder: Secondary | ICD-10-CM

## 2019-08-29 DIAGNOSIS — Z79899 Other long term (current) drug therapy: Secondary | ICD-10-CM

## 2019-08-29 MED ORDER — CITALOPRAM HYDROBROMIDE 40 MG PO TABS: 40.0000 mg | ORAL_TABLET | Freq: Every day | ORAL | 1 refills | Status: DC

## 2019-08-29 MED ORDER — ALPRAZOLAM 0.5 MG PO TABS: 0.5000 mg | ORAL_TABLET | Freq: Two times a day (BID) | ORAL | 0 refills | Status: DC | PRN

## 2019-08-29 NOTE — Progress Notes (Signed)
Virtual Visit via Video Note  I connected with@ on 08/29/19 at 11:00 AM EDT by a video enabled telemedicine application and verified that I am speaking with the correct person using two identifiers. Location patient: home Location provider:work  office Persons participating in the virtual visit: patient, provider technical problems with the server   Contacted patient through doxy app phone  [place] national recommendations  regarding COVID 19 pandemic   video visit is advised over in office visit for this patient.  Patient aware  of the limitations of evaluation and management by telemedicine and  availability of in person appointments. and agreed to proceed.   HPI: Kathleen Hayes presents for video visit med check missed last week  Last televisit jan 21  Now is taking  Citalopram 20 mg 2  In evening and seems to be working well   Would like to continue  Feels much better Less [place] of alprazolam  About 2 x in past  2 weeks usually in evening  After work or so   Requests for 40 mg   If available .   ROS: See pertinent positives and negatives per HPI.  Past Medical History:  Diagnosis Date  . Concussion   . HEAD TRAUMA, CLOSED 06/08/2007   Qualifier: Diagnosis of  By: Regis Bill MD, Standley Brooking   . History of chlamydia 12/28/2012  . Shingles     Past Surgical History:  Procedure Laterality Date  . ADENOIDECTOMY  age 29  . MYRINGOTOMY  10 months  . WISDOM TOOTH EXTRACTION      Family History  Problem Relation Age of Onset  . Mitral valve prolapse Father   . Hypertension Father   . Cancer Maternal Grandfather        PANCREATIC  . Cancer Paternal Grandfather        BRAIN TUMOR  . Hypertension Paternal Grandfather     Social History   Tobacco Use  . Smoking status: Never Smoker  . Smokeless tobacco: Never Used  Substance Use Topics  . Alcohol use: Yes    Alcohol/week: 0.0 standard drinks    Comment: Rare  . Drug use: No      Current Outpatient Medications:  .  ALPRAZolam  (XANAX) 0.5 MG tablet, Take 1 tablet (0.5 mg total) by mouth 2 (two) times daily as needed for anxiety. attacks, Disp: 10 tablet, Rfl: 0 .  norgestimate-ethinyl estradiol (SPRINTEC 28) 0.25-35 MG-MCG tablet, Take 1 tablet by mouth daily., Disp: 3 Package, Rfl: 4 .  cetirizine (ZYRTEC) 10 MG tablet, Take 10 mg by mouth daily., Disp: , Rfl:  .  citalopram (CELEXA) 40 MG tablet, Take 1 tablet (40 mg total) by mouth daily., Disp: 90 tablet, Rfl: 1  EXAM: BP Readings from Last 3 Encounters:  05/04/18 (!) 98/50  03/03/18 102/62  03/02/18 110/80    VITALS per patient if applicable:  GENERAL: alert, oriented, appears well and in no acute distress no signs of respiratory distress, breathing rate appears normal, no obvious gross SOB, gasping or wheezing YCH/NEURO: pleasant and cooperative, no obvious depression or anxiety, speech and thought processing grossly intact   ASSESSMENT AND PLAN:  Discussed the following assessment and plan:    ICD-10-CM   1. Anxiety state  F41.1    improved on current meds  ocass rescue  2. Medication management  Z79.899    Inc refill citalopram to 40 mg  And reifll recuse alpraz 10  Counseled.  On going   Plan ROV virtual or in person  in 3 months or as needed   Expectant management and discussion of plan and treatment with opportunity to ask questions and all were answered. The patient agreed with the plan and demonstrated an understanding of the instructions.   Advised to call back or seek an in-person evaluation if worsening  or having  further concerns . Return in about 3 months (around 11/29/2019) for medication virtual or in person.   Berniece Andreas, MD

## 2019-08-29 NOTE — Patient Instructions (Signed)
Health Maintenance Due  Topic Date Due  . INFLUENZA VACCINE  Never done    Depression screen [facility] 2/9 05/15/2017  Decreased Interest 0  Down, Depressed, Hopeless 0  PHQ - 2 Score 0

## 2019-09-14 ENCOUNTER — Other Ambulatory Visit: Payer: Self-pay | Admitting: Internal Medicine

## 2019-09-19 NOTE — Telephone Encounter (Signed)
Last OV 08/29/2019  Last filled 08/29/2019, # 10 with 0 refills

## 2019-12-07 ENCOUNTER — Telehealth: Payer: Self-pay

## 2019-12-07 NOTE — Telephone Encounter (Signed)
Called and left a detailed voice message for patient to call back to schedule a Virtual visit since she is out of town and has a possible ear infection per her MyChart message.

## 2019-12-19 ENCOUNTER — Ambulatory Visit (INDEPENDENT_AMBULATORY_CARE_PROVIDER_SITE_OTHER)
Admission: RE | Admit: 2019-12-19 | Discharge: 2019-12-19 | Disposition: A | Discharge status: Other Acute Inpatient Hospital | Payer: Self-pay | Source: Ambulatory Visit

## 2019-12-19 DIAGNOSIS — H9202 Otalgia, left ear: Secondary | ICD-10-CM

## 2019-12-19 NOTE — ED Provider Notes (Signed)
Virtual Visit via Video Note:  Kathleen Hayes  initiated request for Telemedicine visit with [facility] team. I connected with Kathleen Hayes  on 12/19/2019 at 4:32 PM  for a synchronized telemedicine visit using a video enabled HIPPA compliant telemedicine application. I verified that I am speaking with Kathleen Hayes  using two identifiers. Mickie Bail, NP  was physically located in a [facility] Urgent care site and Reeve Mallo was located at a different location.   The limitations of evaluation and management by telemedicine as well as the availability of in-person appointments were discussed. Patient was informed that she  may incur a bill ( including co-pay) for this virtual visit encounter. Kathleen Hayes  expressed understanding and gave verbal consent to proceed with virtual visit.     History of Present Illness:Kathleen Hayes  is a 29 y.o. female presents for evaluation of left ear pain x 1 week.  She also reports small amount of drainage and pain with movement.    Allergies  Allergen Reactions  . Sulfonamide Derivatives Hives     Past Medical History:  Diagnosis Date  . Concussion   . HEAD TRAUMA, CLOSED 06/08/2007   Qualifier: Diagnosis of  By: Fabian Sharp MD, Neta Mends   . History of chlamydia 12/28/2012  . Shingles      Social History   Tobacco Use  . Smoking status: Never Smoker  . Smokeless tobacco: Never Used  Vaping Use  . Vaping Use: Never used  Substance Use Topics  . Alcohol use: Yes    Alcohol/week: 0.0 standard drinks    Comment: Rare  . Drug use: No        Observations/Objective: Physical Exam  VITALS: Patient denies fever. GENERAL: Alert, appears well and in no acute distress. HEENT: Atraumatic. Oral mucosa appears moist. NECK: Normal movements of the head and neck. CARDIOPULMONARY: No increased WOB. Speaking in clear sentences. I:E ratio WNL.  MS: Moves all visible extremities without noticeable abnormality. PSYCH:  Pleasant and cooperative, well-groomed. Speech normal rate and rhythm. Affect is appropriate. Insight and judgement are appropriate. Attention is focused, linear, and appropriate.  NEURO: CN grossly intact. Oriented as arrived to appointment on time with no prompting. Moves both UE equally.  SKIN: No obvious lesions, wounds, erythema, or cyanosis noted on face or hands.   Assessment and Plan:    ICD-10-CM   1. Otalgia, left  H92.02        Follow Up Instructions: Discussed limitations of video visit examination for ear pain.  Instructed patient to come to the urgent care for evaluation in person.  Patient agrees to plan of care.     I discussed the assessment and treatment plan with the patient. The patient was provided an opportunity to ask questions and all were answered. The patient agreed with the plan and demonstrated an understanding of the instructions.   The patient was advised to call back or seek an in-person evaluation if the symptoms worsen or if the condition fails to improve as anticipated.      Mickie Bail, NP  12/19/2019 4:32 PM         Mickie Bail, NP 12/19/19 [phone]

## 2019-12-19 NOTE — Discharge Instructions (Addendum)
Come to the Urgent Care for evaluation of your ear pain.

## 2019-12-21 MED ORDER — CITALOPRAM HYDROBROMIDE 40 MG PO TABS: 40.0000 mg | ORAL_TABLET | Freq: Every day | ORAL | 1 refills | Status: DC

## 2019-12-21 MED ORDER — ALPRAZOLAM 0.5 MG PO TABS: 0.5000 mg | ORAL_TABLET | Freq: Two times a day (BID) | ORAL | 0 refills | Status: DC | PRN

## 2019-12-21 NOTE — Telephone Encounter (Signed)
Please find and save  pharmacy in the sytem  So I can do the refill

## 2019-12-21 NOTE — Telephone Encounter (Signed)
Pt is calling in with the pharmacy information [facility] [address] in [place]

## 2019-12-21 NOTE — Telephone Encounter (Signed)
Sent in electronically .  

## 2019-12-21 NOTE — Telephone Encounter (Signed)
Pharmacy has been changed. Please see and confirm.

## 2019-12-21 NOTE — Telephone Encounter (Signed)
Called and left a voice message for patient for her to call us back and let us know which [facility] in [place] that she wants [place] to send to as there are 3 listed in the system.

## 2020-03-15 ENCOUNTER — Other Ambulatory Visit: Payer: Self-pay | Admitting: Internal Medicine

## 2020-03-15 DIAGNOSIS — F411 Generalized anxiety disorder: Secondary | ICD-10-CM

## 2020-03-15 NOTE — Telephone Encounter (Signed)
Last OV 08/29/2019  Last filled 12/21/2019, # 10 with 0  Can you fill in Dr. Rosezella [place] absence?

## 2020-03-16 NOTE — Telephone Encounter (Signed)
Lab 03/03/18 OV 08/29/19 Filled 7/10 #10

## 2020-05-20 ENCOUNTER — Other Ambulatory Visit: Payer: Self-pay | Admitting: Family Medicine

## 2020-05-21 NOTE — Telephone Encounter (Signed)
Refill sent.

## 2020-07-05 ENCOUNTER — Other Ambulatory Visit: Payer: Self-pay | Admitting: Family Medicine

## 2020-07-05 NOTE — Telephone Encounter (Signed)
Have patient make appt  Before further refills  Virtual ok

## 2020-07-05 NOTE — Telephone Encounter (Signed)
Left a detailed message at the pts cell number to call for a follow up appt as below.

## 2020-07-19 MED ORDER — CITALOPRAM HYDROBROMIDE 40 MG PO TABS: 40.0000 mg | ORAL_TABLET | Freq: Every day | ORAL | 0 refills | Status: DC

## 2020-08-14 ENCOUNTER — Other Ambulatory Visit: Payer: Self-pay | Admitting: Internal Medicine

## 2020-10-10 ENCOUNTER — Telehealth: Payer: Self-pay | Admitting: Family

## 2020-10-10 DIAGNOSIS — J029 Acute pharyngitis, unspecified: Secondary | ICD-10-CM

## 2020-10-10 MED ORDER — AMOXICILLIN 500 MG PO CAPS: 500.0000 mg | ORAL_CAPSULE | Freq: Two times a day (BID) | ORAL | 0 refills | Status: AC

## 2020-10-10 NOTE — Progress Notes (Signed)

## 2020-10-17 ENCOUNTER — Telehealth: Payer: Self-pay | Admitting: Nurse Practitioner

## 2020-10-17 DIAGNOSIS — J029 Acute pharyngitis, unspecified: Secondary | ICD-10-CM

## 2020-10-17 NOTE — Progress Notes (Signed)
E-Visit for Sore Throat  We are sorry that you are not feeling well.  Here is how we plan to help!  * If the antibiotic did not help then probably not strep. It is probably just a virus and will have to run its course. Viral sore thrats can last up to 10 days. See information provided below.  Your symptoms indicate a likely viral infection (Pharyngitis).   Pharyngitis is inflammation in the back of the throat which can cause a sore throat, scratchiness and sometimes difficulty swallowing.   Pharyngitis is typically caused by a respiratory virus and will just run its course.  Please keep in mind that your symptoms could last up to 10 days.  For throat pain, we recommend over the counter oral pain relief medications such as acetaminophen or aspirin, or anti-inflammatory medications such as ibuprofen or naproxen sodium.  Topical treatments such as oral throat lozenges or sprays may be used as needed.  Avoid close contact with loved ones, especially the very young and elderly.  Remember to wash your hands thoroughly throughout the day as this is the number one way to prevent the spread of infection and wipe down door knobs and counters with disinfectant.  After careful review of your answers, I would not recommend and antibiotic for your condition.  Antibiotics should not be used to treat conditions that we suspect are caused by viruses like the virus that causes the common cold or flu. However, some people can have Strep with atypical symptoms. You may need formal testing in clinic or office to confirm if your symptoms continue or worsen.  Providers prescribe antibiotics to treat infections caused by bacteria. Antibiotics are very powerful in treating bacterial infections when they are used properly.  To maintain their effectiveness, they should be used only when necessary.  Overuse of antibiotics has resulted in the development of super bugs that are resistant to treatment!    Home Care:  Only take  medications as instructed by your medical team.  Do not drink alcohol while taking these medications.  A steam or ultrasonic humidifier can help congestion.  You can place a towel over your head and breathe in the steam from hot water coming from a faucet.  Avoid close contacts especially the very young and the elderly.  Cover your mouth when you cough or sneeze.  Always remember to wash your hands.  Get Help Right Away If:  You develop worsening fever or throat pain.  You develop a severe head ache or visual changes.  Your symptoms persist after you have completed your treatment plan.  Make sure you  Understand these instructions.  Will watch your condition.  Will get help right away if you are not doing well or get worse.  Your e-visit answers were reviewed by a board certified advanced clinical practitioner to complete your personal care plan.  Depending on the condition, your plan could have included both over the counter or prescription medications.  If there is a problem please reply  once you have received a response from your provider.  Your safety is important to [place].  If you have drug allergies check your prescription carefully.    You can use MyChart to ask questions about todays visit, request a non-urgent call back, or ask for a work or school excuse for 24 hours related to this e-Visit. If it has been greater than 24 hours you will need to follow up with your provider, or enter a new e-Visit to  address those concerns.  You will get an e-mail in the next two days asking about your experience.  I hope that your e-visit has been valuable and will speed your recovery. Thank you for using e-visits.  5-10 minutes spent reviewing and documenting in chart.

## 2020-10-23 ENCOUNTER — Telehealth: Payer: Self-pay | Admitting: Physician Assistant

## 2020-10-23 DIAGNOSIS — J069 Acute upper respiratory infection, unspecified: Secondary | ICD-10-CM

## 2020-10-23 DIAGNOSIS — B9689 Other specified bacterial agents as the cause of diseases classified elsewhere: Secondary | ICD-10-CM

## 2020-10-23 MED ORDER — AZITHROMYCIN 250 MG PO TABS: ORAL_TABLET | ORAL | 0 refills | Status: AC

## 2020-10-23 MED ORDER — BENZONATATE 100 MG PO CAPS: 100.0000 mg | ORAL_CAPSULE | Freq: Three times a day (TID) | ORAL | 0 refills | Status: DC | PRN

## 2020-10-23 NOTE — Progress Notes (Signed)
We are sorry that you are not feeling well.  Here is how we plan to help!  Based on your presentation I believe you most likely have A cough due to bacteria.  When patients have a productive cough with a change in color or increased sputum production, we are concerned about bacterial bronchitis.  If left untreated it can progress to pneumonia.  If your symptoms do not improve with your treatment plan it is important that you contact your provider.   I have prescribed Azithromyin 250 mg: two tablets now and then one tablet daily for 4 additonal days    In addition you may use A prescription cough medication called Tessalon Perles 100mg. You may take 1-2 capsules every 8 hours as needed for your cough.  From your responses in the eVisit questionnaire you describe inflammation in the upper respiratory tract which is causing a significant cough.  This is commonly called Bronchitis and has four common causes:    Allergies  Viral Infections  Acid Reflux  Bacterial Infection Allergies, viruses and acid reflux are treated by controlling symptoms or eliminating the cause. An example might be a cough caused by taking certain blood pressure medications. You stop the cough by changing the medication. Another example might be a cough caused by acid reflux. Controlling the reflux helps control the cough.  USE OF BRONCHODILATOR ("RESCUE") INHALERS: There is a risk from using your bronchodilator too frequently.  The risk is that over-reliance on a medication which only relaxes the muscles surrounding the breathing tubes can reduce the effectiveness of medications prescribed to reduce swelling and congestion of the tubes themselves.  Although you feel brief relief from the bronchodilator inhaler, your asthma may actually be worsening with the tubes becoming more swollen and filled with mucus.  This can delay other crucial treatments, such as oral steroid medications. If you need to use a bronchodilator inhaler  daily, several times per day, you should discuss this with your provider.  There are probably better treatments that could be used to keep your asthma under control.     HOME CARE . Only take medications as instructed by your medical team. . Complete the entire course of an antibiotic. . Drink plenty of fluids and get plenty of rest. . Avoid close contacts especially the very young and the elderly . Cover your mouth if you cough or cough into your sleeve. . Always remember to wash your hands . A steam or ultrasonic humidifier can help congestion.   GET HELP RIGHT AWAY IF: . You develop worsening fever. . You become short of breath . You cough up blood. . Your symptoms persist after you have completed your treatment plan MAKE SURE YOU   Understand these instructions.  Will watch your condition.  Will get help right away if you are not doing well or get worse.  Your e-visit answers were reviewed by a board certified advanced clinical practitioner to complete your personal care plan.  Depending on the condition, your plan could have included both over the counter or prescription medications. If there is a problem please reply  once you have received a response from your provider. Your safety is important to us.  If you have drug allergies check your prescription carefully.    You can use MyChart to ask questions about today's visit, request a non-urgent call back, or ask for a work or school excuse for 24 hours related to this e-Visit. If it has been greater than 24 hours   you will need to follow up with your provider, or enter a new e-Visit to address those concerns. You will get an e-mail in the next two days asking about your experience.  I hope that your e-visit has been valuable and will speed your recovery. Thank you for using e-visits.   

## 2020-10-23 NOTE — Progress Notes (Signed)
I have spent 5 minutes in review of e-visit questionnaire, review and updating patient chart, medical decision making and response to patient.   Malone Vanblarcom Cody Coila Wardell, PA-C    

## 2020-10-31 ENCOUNTER — Telehealth: Payer: Self-pay | Admitting: Family

## 2020-10-31 DIAGNOSIS — Z20822 Contact with and (suspected) exposure to covid-19: Secondary | ICD-10-CM

## 2020-10-31 MED ORDER — ONDANSETRON HCL 4 MG PO TABS: 4.0000 mg | ORAL_TABLET | Freq: Three times a day (TID) | ORAL | 0 refills | Status: DC | PRN

## 2020-10-31 MED ORDER — FLUTICASONE PROPIONATE 50 MCG/ACT NA SUSP: 2.0000 | Freq: Every day | NASAL | 6 refills | Status: DC

## 2020-10-31 NOTE — Progress Notes (Signed)
E-Visit for Corona Virus Screening  Your current symptoms could be consistent with the coronavirus.  Many health care providers can now test patients at their office but not all are.  [facility] has multiple testing sites. For information on our COVID testing locations and hours go to [email or web address]  We are enrolling you in our MyChart Home Monitoring for COVID19 . Daily you will receive a questionnaire within the MyChart website. Our COVID 19 response team will be monitoring your responses daily.  Testing Information: The COVID-19 Community Testing sites are testing BY APPOINTMENT ONLY.  You can schedule online at [email or web address]  If you do not have access to a smart phone or computer you may call [phone] for an appointment.   Additional testing sites in the Community:  . For [facility] Testing sites in [facility]  [email or web address]  . For Pop-up testing sites in [place]  [email or web address]  . For [facility] [email or web address]  . For [facility] testing in [place] and [employer] [email or web address]  . For [facility] testing in [facility]   [email or web address]  For  more information about community testing call [phone]   Please quarantine yourself while awaiting your test results. Please stay home for a minimum of 10 days from the first day of illness with improving symptoms and you have had 24 hours of no fever (without the use of Tylenol (Acetaminophen) Motrin (Ibuprofen) or any fever reducing medication).  Also - Do not get tested prior to returning to work because once you have had a positive test the test can stay  positive for more than a month in some cases.   You should wear a mask or cloth face covering over your nose and mouth if you must be around other people or animals, including pets (even at home). Try to stay at least 6 feet away from other people. This will protect the people around you.  Please continue good preventive care measures, including:  frequent hand-washing, avoid touching your face, cover coughs/sneezes, stay out of crowds and keep a 6 foot distance from others.  COVID-19 is a respiratory illness with symptoms that are similar to the flu. Symptoms are typically mild to moderate, but there have been cases of severe illness and death due to the virus.   The following symptoms may appear 2-14 days after exposure: . Fever . Cough . Shortness of breath or difficulty breathing . Chills . Repeated shaking with chills . Muscle pain . Headache . Sore throat . New loss of taste or smell . Fatigue . Congestion or runny nose . Nausea or vomiting . Diarrhea  Go to the nearest hospital ED for assessment if fever/cough/breathlessness are severe or illness seems like a threat to life.  It is vitally important that if you feel that you have an infection such as this virus or any other virus that you stay home and away from places where you may spread it to others.  You should avoid contact with people age 54 and older.   You can use medication such as prescription for Fluticasone nasal spray 2 sprays in each nostril one time per day and prescription for Zofran 4 mg tablets 1 every 6 hours if needed for nausea.   You may also take acetaminophen (Tylenol) as needed for fever.  Reduce your risk of any infection by using the same precautions used for avoiding the common cold or flu:  Marland Kitchen Wash your hands often with soap and warm water for at least  20 seconds.  If soap and water are not readily available, use an alcohol-based hand sanitizer with at least 60% alcohol.  . If coughing or sneezing, cover  your mouth and nose by coughing or sneezing into the elbow areas of your shirt or coat, into a tissue or into your sleeve (not your hands). . Avoid shaking hands with others and consider head nods or verbal greetings only. . Avoid touching your eyes, nose, or mouth with unwashed hands.  . Avoid close contact with people who are sick. . Avoid places or events with large numbers of people in one location, like concerts or sporting events. . Carefully consider travel plans you have or are making. . If you are planning any travel outside or inside the [place], visit the CDC's Travelers' Health webpage for the latest health notices. . If you have some symptoms but not all symptoms, continue to monitor at home and seek medical attention if your symptoms worsen. . If you are having a medical emergency, call 911.  HOME CARE . Only take medications as instructed by your medical team. . Drink plenty of fluids and get plenty of rest. . A steam or ultrasonic humidifier can help if you have congestion.   GET HELP RIGHT AWAY IF YOU HAVE EMERGENCY WARNING SIGNS** FOR COVID-19. If you or someone is showing any of these signs seek emergency medical care immediately. Call 911 or proceed to your closest emergency facility if: . You develop worsening high fever. . Trouble breathing . Bluish lips or face . Persistent pain or pressure in the chest . New confusion . Inability to wake or stay awake . You cough up blood. . Your symptoms become more severe  **This list is not all possible symptoms. Contact your medical provider for any symptoms that are sever or concerning to you.  MAKE SURE YOU   Understand these instructions.  Will watch your condition.  Will get help right away if you are not doing well or get worse.  Your e-visit answers were reviewed by a board certified advanced clinical practitioner to complete your personal care plan.  Depending on the condition, your plan could have included both over  the counter or prescription medications.  If there is a problem please reply once you have received a response from your provider.  Your safety is important to [place].  If you have drug allergies check your prescription carefully.    You can use MyChart to ask questions about today's visit, request a non-urgent call back, or ask for a work or school excuse for 24 hours related to this e-Visit. If it has been greater than 24 hours you will need to follow up with your provider, or enter a new e-Visit to address those concerns. You will get an e-mail in the next two days asking about your experience.  I hope that your e-visit has been valuable and will speed your recovery. Thank you for using e-visits.   Approximately 5 minutes was spent documenting and reviewing patient's chart.

## 2020-11-19 ENCOUNTER — Other Ambulatory Visit: Payer: Self-pay | Admitting: Internal Medicine

## 2020-12-24 ENCOUNTER — Other Ambulatory Visit: Payer: Self-pay | Admitting: Internal Medicine

## 2020-12-27 ENCOUNTER — Other Ambulatory Visit: Payer: Self-pay

## 2020-12-27 ENCOUNTER — Telehealth: Payer: Self-pay | Admitting: Internal Medicine

## 2020-12-27 NOTE — Progress Notes (Deleted)
Patient  did not log on .  Attempted to contact for over 30 minutes .  Ok to contact  her llater and rx 14 days of med until can remake appt.

## 2020-12-28 ENCOUNTER — Other Ambulatory Visit: Payer: Self-pay

## 2020-12-28 MED ORDER — CITALOPRAM HYDROBROMIDE 40 MG PO TABS: ORAL_TABLET | ORAL | 0 refills | Status: DC

## 2021-01-30 ENCOUNTER — Other Ambulatory Visit: Payer: Self-pay | Admitting: Internal Medicine

## 2021-01-31 ENCOUNTER — Encounter: Payer: Self-pay | Admitting: Internal Medicine

## 2021-01-31 ENCOUNTER — Telehealth (INDEPENDENT_AMBULATORY_CARE_PROVIDER_SITE_OTHER): Payer: Self-pay | Admitting: Internal Medicine

## 2021-01-31 VITALS — Ht 63.0 in | Wt 145.0 lb

## 2021-01-31 DIAGNOSIS — F411 Generalized anxiety disorder: Secondary | ICD-10-CM

## 2021-01-31 DIAGNOSIS — R21 Rash and other nonspecific skin eruption: Secondary | ICD-10-CM

## 2021-01-31 DIAGNOSIS — Z79899 Other long term (current) drug therapy: Secondary | ICD-10-CM

## 2021-01-31 MED ORDER — CITALOPRAM HYDROBROMIDE 40 MG PO TABS: ORAL_TABLET | ORAL | 3 refills | Status: DC

## 2021-01-31 NOTE — Progress Notes (Signed)
Virtual Visit via Video Note  I connected with [name] on 01/31/21 at 11:00 AM EDT by a video enabled telemedicine application and verified that I am speaking with the correct person using two identifiers. Location patient: vehicle Location provider: home office Persons participating in the virtual visit: patient, provider  [place] national recommendations  regarding COVID 19 pandemic   video visit is advised over in office visit for this patient.  Patient aware  of the limitations of evaluation and management by telemedicine and  availability of in person appointments. and agreed to proceed.   HPI: Kathleen Hayes presents for video visit for medicine check for citalopram.  Also has a rash for about 4 days after a camping trip using over-the-counter topical Benadryl and it seems to be getting better. She feels that the citalopram 40 mg a day has been very helpful controlling her anxiety and no panic attacks. [place] works for [employer] in person. Work . Negative TD occasional alcohol. Has upcoming appointment with OB/GYN .  No major changes in health    ROS: See pertinent positives and negatives per HPI.  Past Medical History:  Diagnosis Date   Concussion    HEAD TRAUMA, CLOSED 06/08/2007   Qualifier: Diagnosis of  By: Fabian Sharp MD, Neta Mends    History of chlamydia 12/28/2012   Shingles     Past Surgical History:  Procedure Laterality Date   ADENOIDECTOMY  age 63   MYRINGOTOMY  36 months   WISDOM TOOTH EXTRACTION      Family History  Problem Relation Age of Onset   Mitral valve prolapse Father    Hypertension Father    Cancer Maternal Grandfather        PANCREATIC   Cancer Paternal Grandfather        BRAIN TUMOR   Hypertension Paternal Grandfather     Social History   Tobacco Use   Smoking status: Never   Smokeless tobacco: Never  Vaping Use   Vaping Use: Never used  Substance Use Topics    Alcohol use: Yes    Alcohol/week: 0.0 standard drinks    Comment: Rare   Drug use: No      Current Outpatient Medications:    ALPRAZolam (XANAX) 0.5 MG tablet, TAKE 1 TABLET BY MOUTH TWICE DAILY AS NEEDED FOR ANXIETY, Disp: 10 tablet, Rfl: 0   cetirizine (ZYRTEC) 10 MG tablet, Take 10 mg by mouth daily., Disp: , Rfl:    fluticasone (FLONASE) 50 MCG/ACT nasal spray, Place 2 sprays into both nostrils daily., Disp: 16 g, Rfl: 6   norgestimate-ethinyl estradiol (SPRINTEC 28) 0.25-35 MG-MCG tablet, Take 1 tablet by mouth daily., Disp: 3 Package, Rfl: 4   citalopram (CELEXA) 40 MG tablet, Take 1 tablet by mouth once daily, Disp: 90 tablet, Rfl: 3  EXAM: BP Readings from Last 3 Encounters:  05/04/18 (!) 98/50  03/03/18 102/62  03/02/18 110/80    VITALS per patient if applicable:  GENERAL: alert, oriented, appears well and in no acute distress  HEENT: atraumatic, conjunttiva clear, no obvious abnormalities on inspection of external nose and ears  NECK: normal movements of the head and neck  LUNGS: on inspection no signs of respiratory distress, breathing rate appears normal, no obvious gross SOB, gasping or wheezing  CV: no obvious cyanosis  MS: moves all visible extremities without noticeable abnormality Skin  fading papule patch rash thigh and left trunk  no edema  no target  lesions. Arm Tats   PSYCH/NEURO: pleasant and cooperative, no obvious depression or anxiety, speech and thought processing grossly intact  ASSESSMENT AND PLAN:  Discussed the following assessment and plan:    ICD-10-CM   1. Medication management  Z79.899    benefit more than risk continue med     2. Anxiety state  F41.1     3. Rash  R21    prob contact vs insect topicals can use hcs if needed .      Counseled.  Glad she is doing well.  Will refill med for 1 year  and have her keep her gyne appts.  Med check in 6-12 months depending .    Expectant management and discussion of plan and treatment  with opportunity to ask questions and all were answered. The patient agreed with the plan and demonstrated an understanding of the instructions.   Advised to call back or seek an in-person evaluation if worsening  or having  further concerns . Return for med check 6-12 mos o ra s indicated .    Berniece Andreas, MD

## 2021-02-13 ENCOUNTER — Other Ambulatory Visit: Payer: Self-pay | Admitting: Family Medicine

## 2021-02-14 MED ORDER — ALPRAZOLAM 0.5 MG PO TABS: 0.5000 mg | ORAL_TABLET | Freq: Two times a day (BID) | ORAL | 0 refills | Status: DC | PRN

## 2021-06-23 ENCOUNTER — Other Ambulatory Visit: Payer: Self-pay | Admitting: Internal Medicine

## 2021-06-25 ENCOUNTER — Telehealth: Payer: Self-pay | Admitting: Physician Assistant

## 2021-06-25 DIAGNOSIS — J019 Acute sinusitis, unspecified: Secondary | ICD-10-CM

## 2021-06-25 DIAGNOSIS — B9689 Other specified bacterial agents as the cause of diseases classified elsewhere: Secondary | ICD-10-CM

## 2021-06-25 MED ORDER — DOXYCYCLINE HYCLATE 100 MG PO TABS
100.0000 mg | ORAL_TABLET | Freq: Two times a day (BID) | ORAL | 0 refills | Status: DC
Start: 2021-06-25 — End: 2021-08-28

## 2021-06-25 NOTE — Progress Notes (Signed)

## 2021-06-25 NOTE — Progress Notes (Signed)
I have spent 5 minutes in review of e-visit questionnaire, review and updating patient chart, medical decision making and response to patient.   Adrick Kestler Cody Nora Sabey, PA-C    

## 2021-06-26 NOTE — Telephone Encounter (Signed)
Last refill per controlled substance database: 02/14/21 Last OV: VV 01/31/21 Next OV: none scheduled

## 2021-07-04 ENCOUNTER — Telehealth: Payer: Self-pay | Admitting: Physician Assistant

## 2021-07-04 DIAGNOSIS — J0110 Acute frontal sinusitis, unspecified: Secondary | ICD-10-CM

## 2021-07-04 NOTE — Progress Notes (Signed)
Based on what you shared with me, I feel your condition warrants further evaluation and I recommend that you be seen in a face to face visit.  Giving no improvement with treatment given, you should be evaluated in person and so further treatment adjustments can be given. I will provide a work note for today to allow you time to be seen.    NOTE: There will be NO CHARGE for this eVisit   If you are having a true medical emergency please call 911.      For an urgent face to face visit, [facility] has six urgent care centers for your convenience:     [facility] [place] Get Driving Directions [id number] [address] [place], [place] [address]    [facility]) Get Driving Directions [id number] [place], [place] [address]  [facility] Get Driving Directions [id number] [address] [place],  [place]  [address]  [facility] Get Driving Directions [id number] [address] [place], [place], [place] [address]   [facility] Get Driving Directions  [id number] [address] [place], [place] [address]   [facility] [place] Get Driving Directions [id number] [address] [place],  [address]  Your MyChart E-visit questionnaire answers were reviewed by a board certified advanced clinical practitioner to complete your personal care plan based on your specific symptoms.  Thank you for using e-Visits.

## 2021-07-25 ENCOUNTER — Telehealth: Payer: Self-pay | Admitting: Physician Assistant

## 2021-07-25 DIAGNOSIS — J329 Chronic sinusitis, unspecified: Secondary | ICD-10-CM

## 2021-07-25 NOTE — Progress Notes (Signed)
Based on what you shared with me, I feel your condition warrants further evaluation and I recommend that you be seen in a face to face visit.  Giving recurrent sinus symptoms after recent e-visit and treatment, it is highly recommended you be evaluated in person for a PCR COVID test and detailed examination.    NOTE: There will be NO CHARGE for this eVisit   If you are having a true medical emergency please call 911.      For an urgent face to face visit, [facility] has six urgent care centers for your convenience:     [facility] at [facility] Directions [phone] [address] [place], [place] [address]    [facility]) Get Driving Directions [phone] [address] [place], [place] [address]  [facility]) Get Driving Directions [phone] [address] [place],  [place]  [address]  [facility] at [facility] Get Driving Directions [phone] [address] [place], [place] [address]   [facility] at [facility] Get Driving Directions  [phone] [address] [place], [place] [address]   [facility] at [facility] Directions [phone] [address], [address] [place], [place] [address]  Your MyChart E-visit questionnaire answers were reviewed by a board certified advanced clinical practitioner to complete your personal care plan based on your specific symptoms.  Thank you for using e-Visits.

## 2021-08-19 ENCOUNTER — Telehealth: Payer: Self-pay | Admitting: Nurse Practitioner

## 2021-08-19 DIAGNOSIS — Z20818 Contact with and (suspected) exposure to other bacterial communicable diseases: Secondary | ICD-10-CM

## 2021-08-19 MED ORDER — AMOXICILLIN 500 MG PO CAPS: 500.0000 mg | ORAL_CAPSULE | Freq: Two times a day (BID) | ORAL | 0 refills | Status: DC

## 2021-08-19 NOTE — Progress Notes (Signed)
E-Visit for Sore Throat - Strep Symptoms ? ?We are sorry that you are not feeling well.  Here is how we plan to help! ? ?Based on what you have shared with me it is likely that you have strep pharyngitis.  Strep pharyngitis is inflammation and infection in the back of the throat.  This is an infection cause by bacteria and is treated with antibiotics.  I have prescribed Amoxicillin 500 mg twice a day for 10 days. For throat pain, we recommend over the counter oral pain relief medications such as acetaminophen or aspirin, or anti-inflammatory medications such as ibuprofen or naproxen sodium. Topical treatments such as oral throat lozenges or sprays may be used as needed. Strep infections are not as easily transmitted as other respiratory infections, however we still recommend that you avoid close contact with loved ones, especially the very young and elderly.  Remember to wash your hands thoroughly throughout the day as this is the number one way to prevent the spread of infection and wipe down door knobs and counters with disinfectant. ? ? ?Home Care: ?Only take medications as instructed by your medical team. ?Complete the entire course of an antibiotic. ?Do not take these medications with alcohol. ?A steam or ultrasonic humidifier can help congestion.  You can place a towel over your head and breathe in the steam from hot water coming from a faucet. ?Avoid close contacts especially the very young and the elderly. ?Cover your mouth when you cough or sneeze. ?Always remember to wash your hands. ? ?Get Help Right Away If: ?You develop worsening fever or sinus pain. ?You develop a severe head ache or visual changes. ?Your symptoms persist after you have completed your treatment plan. ? ?Make sure you ?Understand these instructions. ?Will watch your condition. ?Will get help right away if you are not doing well or get worse. ? ? ?Thank you for choosing an e-visit. ? ?Your e-visit answers were reviewed by a board  certified advanced clinical practitioner to complete your personal care plan. Depending upon the condition, your plan could have included both over the counter or prescription medications. ? ?Please review your pharmacy choice. Make sure the pharmacy is open so you can pick up prescription now. If there is a problem, you may contact your provider through [employer] and have the prescription routed to another pharmacy.  Your safety is important to [place]. If you have drug allergies check your prescription carefully.  ? ?For the next 24 hours you can use MyChart to ask questions about today's visit, request a non-urgent call back, or ask for a work or school excuse. ?You will get an email in the next two days asking about your experience. I hope that your e-visit has been valuable and will speed your recovery.  ? ?I spent approximately 10 minutes reviewing the patient's history, current symptoms and coordinating their care today.   ? ?Meds ordered this encounter  ?Medications  ? amoxicillin (AMOXIL) 500 MG capsule  ?  Sig: Take 1 capsule (500 mg total) by mouth 2 (two) times daily for 10 days.  ?  Dispense:  20 capsule  ?  Refill:  0  ?  ?

## 2021-08-28 ENCOUNTER — Telehealth: Payer: BLUE CROSS/BLUE SHIELD | Admitting: Family

## 2021-08-28 ENCOUNTER — Telehealth: Payer: Self-pay

## 2021-08-28 DIAGNOSIS — J019 Acute sinusitis, unspecified: Secondary | ICD-10-CM

## 2021-08-28 MED ORDER — AMOXICILLIN-POT CLAVULANATE 875-125 MG PO TABS: 1.0000 | ORAL_TABLET | Freq: Two times a day (BID) | ORAL | 0 refills | Status: DC

## 2021-08-28 NOTE — Progress Notes (Signed)
?Virtual Visit Consent  ? ?Cari Caraway, you are scheduled for a virtual visit with a [facility] provider today.   ?  ?Just as with appointments in the office, your consent must be obtained to participate.  Your consent will be active for this visit and any virtual visit you may have with one of our providers in the next 365 days.   ?  ?If you have a MyChart account, a copy of this consent can be sent to you electronically.  All virtual visits are billed to your insurance company just like a traditional visit in the office.   ? ?As this is a virtual visit, video technology does not allow for your provider to perform a traditional examination.  This may limit your provider's ability to fully assess your condition.  If your provider identifies any concerns that need to be evaluated in person or the need to arrange testing (such as labs, EKG, etc.), we will make arrangements to do so.   ?  ?Although advances in technology are sophisticated, we cannot ensure that it will always work on either your end or our end.  If the connection with a video visit is poor, the visit may have to be switched to a telephone visit.  With either a video or telephone visit, we are not always able to ensure that we have a secure connection.    ? ?I need to obtain your verbal consent now.   Are you willing to proceed with your visit today?  ?  ?Kathleen Hayes has provided verbal consent on 08/28/2021 for a virtual visit (video or telephone). ?  ?Evelina Dun, FNP  ? ?Date: 08/28/2021 5:47 PM ? ? ?Virtual Visit via Video Note  ? ?IEvelina Dun, connected with  Kathleen Hayes  ([id number], [birth year]) on 08/28/21 at  5:45 PM EDT by a video-enabled telemedicine application and verified that I am speaking with the correct person using two identifiers. ? ?Location: ?Patient: Virtual Visit Location Patient: Home ?Provider: Virtual Visit Location Provider: Office/Clinic ?  ?I discussed the limitations of evaluation and management by  telemedicine and the availability of in person appointments. The patient expressed understanding and agreed to proceed.   ? ?History of Present Illness: ?Kathleen Hayes is a 31 y.o. who identifies as a female who was assigned female at birth, and is being seen today for headaches and sinuses pressure. ? ?HPI: Sinusitis ?This is a new problem. The current episode started 1 to 4 weeks ago. The problem has been gradually worsening since onset. There has been no fever. Her pain is at a severity of 6/10. The pain is moderate. Associated symptoms include congestion, headaches, sinus pressure, sneezing and a sore throat. Pertinent negatives include no chills, coughing, ear pain or hoarse voice. Past treatments include oral decongestants. The treatment provided mild relief.   ?Problems:  ?Patient Active Problem List  ? Diagnosis Date Noted  ? Enlarged tonsils 01/15/2012  ? Hx of tonsillitis 01/15/2012  ? Excessive sleepiness 01/15/2012  ? Visit for preventive health examination 01/15/2012  ? Encounter for preventive health examination 01/15/2011  ? PANIC DISORDER 06/22/2009  ? Anxiety state 04/18/2009  ? DYSMENORRHEA 01/09/2009  ? ALLERGIC RHINITIS CAUSE UNSPECIFIED 01/14/2008  ? ACNE 01/20/2007  ?  ?Allergies:  ?Allergies  ?Allergen Reactions  ? Sulfonamide Derivatives Hives  ? ?Medications:  ?Current Outpatient Medications:  ?  amoxicillin-clavulanate (AUGMENTIN) 875-125 MG tablet, Take 1 tablet by mouth 2 (two) times daily., Disp: 14 tablet, Rfl: 0 ?  ALPRAZolam (  XANAX) 0.5 MG tablet, TAKE 1 TABLET(0.5 MG) BY MOUTH TWICE DAILY AS NEEDED FOR ANXIETY, Disp: 10 tablet, Rfl: 0 ?  cetirizine (ZYRTEC) 10 MG tablet, Take 10 mg by mouth daily., Disp: , Rfl:  ?  citalopram (CELEXA) 40 MG tablet, Take 1 tablet by mouth once daily, Disp: 90 tablet, Rfl: 3 ?  fluticasone (FLONASE) 50 MCG/ACT nasal spray, Place 2 sprays into both nostrils daily., Disp: 16 g, Rfl: 6 ?  norgestimate-ethinyl estradiol (SPRINTEC 28) 0.25-35 MG-MCG  tablet, Take 1 tablet by mouth daily., Disp: 3 Package, Rfl: 4 ? ?Observations/Objective: ?Patient is well-developed, well-nourished in no acute distress.  ?Resting comfortably  at home.  ?Head is normocephalic, atraumatic.  ?No labored breathing.  ?Speech is clear and coherent with logical content.  ?Patient is alert and oriented at baseline.  ?Nasal congestion ?Mild pain in maxillary sinuses  ? ?Assessment and Plan: ?1. Acute sinusitis, recurrence not specified, unspecified location ?- amoxicillin-clavulanate (AUGMENTIN) 875-125 MG tablet; Take 1 tablet by mouth 2 (two) times daily.  Dispense: 14 tablet; Refill: 0 ? ?- Take meds as prescribed ?- Use a cool mist humidifier  ?-Use saline nose sprays frequently ?-Force fluids ?-For any cough or congestion ? Use plain Mucinex- regular strength or max strength is fine ?-For fever or aces or pains- take tylenol or ibuprofen. ?-Throat lozenges if help ?-New toothbrush in 3 days ?PT has had recurrent sinus infection over last 2 months. Will treat today, but advised her to change her zyrtec to xyzal  or allergra.  ?If this does not resolve, she needs to see a specialists  ? ?Follow Up Instructions: ?I discussed the assessment and treatment plan with the patient. The patient was provided an opportunity to ask questions and all were answered. The patient agreed with the plan and demonstrated an understanding of the instructions.  A copy of instructions were sent to the patient via MyChart unless otherwise noted below.  ? ? ? ?The patient was advised to call back or seek an in-person evaluation if the symptoms worsen or if the condition fails to improve as anticipated. ? ?Time:  ?I spent 12 minutes with the patient via telehealth technology discussing the above problems/concerns.   ? ?Evelina Dun, FNP ? ?

## 2021-09-04 ENCOUNTER — Telehealth: Payer: BLUE CROSS/BLUE SHIELD | Admitting: Physician Assistant

## 2021-09-04 DIAGNOSIS — K219 Gastro-esophageal reflux disease without esophagitis: Secondary | ICD-10-CM | POA: Diagnosis not present

## 2021-09-04 MED ORDER — OMEPRAZOLE 20 MG PO CPDR: 20.0000 mg | DELAYED_RELEASE_CAPSULE | Freq: Every day | ORAL | 0 refills | Status: DC

## 2021-09-04 NOTE — Progress Notes (Addendum)
E-Visit for Heartburn ° °We are sorry that you are not feeling well.  Here is how we plan to help! ° °Based on what you shared with me it looks like you most likely have Gastroesophageal Reflux Disease (GERD) ° °Gastroesophageal reflux disease (GERD) happens when acid from your stomach flows up into the esophagus.  When acid comes in contact with the esophagus, the acid causes sorenss (inflammation) in the esophagus.  Over time, GERD may create small holes (ulcers) in the lining of the esophagus. ° °I have prescribed Omeprazole 20 mg one by mouth daily until you follow up with a provider. ° °Your symptoms should improve in the next day or two.  You can use antacids as needed until symptoms resolve.  Call us if your heartburn worsens, you have trouble swallowing, weight loss, spitting up blood or recurrent vomiting. ° °Home Care: °May include lifestyle changes such as weight loss, quitting smoking and alcohol consumption °Avoid foods and drinks that make your symptoms worse, such as: °Caffeine or alcoholic drinks °Chocolate °Peppermint or mint flavorings °Garlic and onions °Spicy foods °Citrus fruits, such as oranges, lemons, or limes °Tomato-based foods such as sauce, chili, salsa and pizza °Fried and fatty foods °Avoid lying down for 3 hours prior to your bedtime or prior to taking a nap °Eat small, frequent meals instead of a large meals °Wear loose-fitting clothing.  Do not wear anything tight around your waist that causes pressure on your stomach. °Raise the head of your bed 6 to 8 inches with wood blocks to help you sleep.  Extra pillows will not help. ° °Seek Help Right Away If: °You have pain in your arms, neck, jaw, teeth or back °Your pain increases or changes in intensity or duration °You develop nausea, vomiting or sweating (diaphoresis) °You develop shortness of breath or you faint °Your vomit is green, yellow, black or looks like coffee grounds or blood °Your stool is red, bloody or black ° °These  symptoms could be signs of other problems, such as heart disease, gastric bleeding or esophageal bleeding. ° °Make sure you : °Understand these instructions. °Will watch your condition. °Will get help right away if you are not doing well or get worse. ° °Your e-visit answers were reviewed by a board certified advanced clinical practitioner to complete your personal care plan.  Depending on the condition, your plan could have included both over the counter or prescription medications. ° °If there is a problem please reply  once you have received a response from your provider. ° °Your safety is important to us.  If you have drug allergies check your prescription carefully.   ° °You can use MyChart to ask questions about today’s visit, request a non-urgent call back, or ask for a work or school excuse for 24 hours related to this e-Visit. If it has been greater than 24 hours you will need to follow up with your provider, or enter a new e-Visit to address those concerns. ° °You will get an e-mail in the next two days asking about your experience.  I hope that your e-visit has been valuable and will speed your recovery. Thank you for using e-visits. ° ° °

## 2021-09-04 NOTE — Progress Notes (Signed)
I have spent 5 minutes in review of e-visit questionnaire, review and updating patient chart, medical decision making and response to patient.   Masiah Woody Cody Keanna Tugwell, PA-C    

## 2021-09-09 ENCOUNTER — Other Ambulatory Visit: Payer: Self-pay | Admitting: Internal Medicine

## 2021-09-09 DIAGNOSIS — L03221 Cellulitis of neck: Secondary | ICD-10-CM | POA: Diagnosis not present

## 2021-09-10 ENCOUNTER — Other Ambulatory Visit: Payer: Self-pay | Admitting: Internal Medicine

## 2021-09-10 ENCOUNTER — Telehealth: Payer: BLUE CROSS/BLUE SHIELD

## 2021-09-11 MED ORDER — ALPRAZOLAM 0.5 MG PO TABS: ORAL_TABLET | ORAL | 0 refills | Status: DC

## 2021-09-11 NOTE — Telephone Encounter (Signed)
Last Vv 01/31/21 ?Filled 06/27/21 ?

## 2021-09-11 NOTE — Telephone Encounter (Signed)
Last Vv 01/31/21 ?Filled 06/27/21 ?Is it ok to refill? ?

## 2021-09-11 NOTE — Telephone Encounter (Signed)
I can refill the alprazolam but if you are getting unusual muscle spasms you should return to care  ( not typical of  recluse  more with back widow or other causes.

## 2021-10-22 ENCOUNTER — Other Ambulatory Visit: Payer: Self-pay | Admitting: Internal Medicine

## 2021-10-22 ENCOUNTER — Encounter: Payer: Self-pay | Admitting: Internal Medicine

## 2021-10-22 ENCOUNTER — Telehealth: Payer: BLUE CROSS/BLUE SHIELD | Admitting: Physician Assistant

## 2021-10-22 DIAGNOSIS — Z760 Encounter for issue of repeat prescription: Secondary | ICD-10-CM

## 2021-10-22 NOTE — Telephone Encounter (Signed)
Last Vv 01/31/21  ?Filled 09/11/21 ?Is it ok to refill? ?

## 2021-10-22 NOTE — Progress Notes (Signed)
Based on what you shared with me, I feel your condition warrants further evaluation and I recommend that you be seen for a face to face visit.  Please contact your primary care physician practice to be seen. Many offices offer virtual options to be seen via video if you are not comfortable going in person to a medical facility at this time. ? ?We do not provide refills of medications as we are here for simple acute issues only. You will need to schedule a follow-up with your PCP for ongoing management of acid reflux if you have not done so, as discussed at last visit. You can take OTC Omeprazole (Prilosec) if needed until you have follow-up.  ? ?NOTE: You will NOT be charged for this eVisit. ? ?If you do not have a PCP, [facility] offers a free physician referral service available at [phone]. Our trained staff has the experience, knowledge and resources to put you in touch with a physician who is right for you.  ? ? ?If you are having a true medical emergency please call 911. ? ? ?Your e-visit answers were reviewed by a board certified advanced clinical practitioner to complete your personal care plan.  Thank you for using e-Visits. ? ?

## 2021-10-23 ENCOUNTER — Other Ambulatory Visit: Payer: Self-pay | Admitting: Internal Medicine

## 2021-10-23 NOTE — Telephone Encounter (Signed)
Last 01/31/22 ?Upcoming appt 10/28/21 ?Filled 09/11/21 ?Is it ok to refill? ?

## 2021-10-24 ENCOUNTER — Telehealth (INDEPENDENT_AMBULATORY_CARE_PROVIDER_SITE_OTHER): Payer: BLUE CROSS/BLUE SHIELD | Admitting: Internal Medicine

## 2021-10-24 DIAGNOSIS — F411 Generalized anxiety disorder: Secondary | ICD-10-CM

## 2021-10-24 DIAGNOSIS — R41840 Attention and concentration deficit: Secondary | ICD-10-CM | POA: Diagnosis not present

## 2021-10-24 DIAGNOSIS — K219 Gastro-esophageal reflux disease without esophagitis: Secondary | ICD-10-CM | POA: Diagnosis not present

## 2021-10-24 DIAGNOSIS — Z760 Encounter for issue of repeat prescription: Secondary | ICD-10-CM

## 2021-10-24 MED ORDER — OMEPRAZOLE 20 MG PO CPDR: 20.0000 mg | DELAYED_RELEASE_CAPSULE | Freq: Every day | ORAL | 1 refills | Status: DC

## 2021-10-24 NOTE — Progress Notes (Signed)
?Virtual Visit via Video Note ? ?I connected with Kathleen Hayes on 10/24/21 at  8:15 AM EDT by a video enabled telemedicine application and verified that I am speaking with the correct person using two identifiers. ?Location patient: home [place] ?Location provider: home office ?Persons participating in the virtual visit: patient, provider ? ?[place] national recommendations  regarding COVID 19 pandemic   to be lifted this pm video visit is advised over in office visit for this patient.  ?Patient aware  of the limitations of evaluation and management by telemedicine and  availability of in person appointments. and agreed to proceed. ? ? ?HPI: ?Kathleen Hayes presents for video visit requesting  refill omeprazole given on a previous e visit for heartburn   with am chest sx  ocass swallowing issues  . Med  helped a lot over 3 days or so . Ran out after 30 d and 3 days later is returning .  The e visit provider advised see pcp or other  care for on going rx . ?Had antibiotics recently  ? ? ?Anxiety  : did have panic attack recently    infrequent use of alpraz   maybe once a month    denies Su excess use of etoh  caffiene.  ? ?Was on adderall xr 15 about 4-5 yrs ago when in [place] for concentration   finding hard to stay focused  at work   no external substance and sleep ok. Doesn't feel depressed.   Had psycho educ asessment in 2019  dr Jason Fila  although attention al issues not dx of add. Other causes  .( See report in media scan)  ?Wonders  how to get help again .  ?ROS: See pertinent positives and negatives per HPI. ?Has had 6 e visit s Vanessa Kick to march 2023  and one video out side of [facility] pcp team   for acute problems  ?Past Medical History:  ?Diagnosis Date  ? Concussion   ? HEAD TRAUMA, CLOSED 06/08/2007  ? Qualifier: Diagnosis of  By: Fabian Sharp MD, Neta Mends   ? History of chlamydia 12/28/2012  ? Shingles   ? ? ?Past Surgical History:  ?Procedure Laterality Date  ? ADENOIDECTOMY  age 18  ? MYRINGOTOMY  10 months  ?  WISDOM TOOTH EXTRACTION    ? ? ?Family History  ?Problem Relation Age of Onset  ? Mitral valve prolapse Father   ? Hypertension Father   ? Cancer Maternal Grandfather   ?     PANCREATIC  ? Cancer Paternal Grandfather   ?     BRAIN TUMOR  ? Hypertension Paternal Grandfather   ? ? ?Social History  ? ?Tobacco Use  ? Smoking status: Never  ? Smokeless tobacco: Never  ?Vaping Use  ? Vaping Use: Never used  ?Substance Use Topics  ? Alcohol use: Yes  ?  Alcohol/week: 0.0 standard drinks  ?  Comment: Rare  ? Drug use: No  ? ? ? ? ?Current Outpatient Medications:  ?  ALPRAZolam (XANAX) 0.5 MG tablet, TAKE 1 TABLET BY MOUTH TWICE DAILY AS NEEDED FOR ANXIETY, Disp: 10 tablet, Rfl: 0 ?  amoxicillin-clavulanate (AUGMENTIN) 875-125 MG tablet, Take 1 tablet by mouth 2 (two) times daily., Disp: 14 tablet, Rfl: 0 ?  cetirizine (ZYRTEC) 10 MG tablet, Take 10 mg by mouth daily., Disp: , Rfl:  ?  citalopram (CELEXA) 40 MG tablet, Take 1 tablet by mouth once daily, Disp: 90 tablet, Rfl: 3 ?  fluticasone (FLONASE) 50 MCG/ACT nasal  spray, Place 2 sprays into both nostrils daily., Disp: 16 g, Rfl: 6 ?  norgestimate-ethinyl estradiol (SPRINTEC 28) 0.25-35 MG-MCG tablet, Take 1 tablet by mouth daily., Disp: 3 Package, Rfl: 4 ?  omeprazole (PRILOSEC) 20 MG capsule, Take 1 capsule (20 mg total) by mouth daily. Or as directed, Disp: 30 capsule, Rfl: 1 ? ?EXAM: ?BP Readings from Last 3 Encounters:  ?05/04/18 (!) 98/50  ?03/03/18 102/62  ?03/02/18 110/80  ? ? ?VITALS per patient if applicable: ? ?GENERAL: alert, oriented, appears well and in no acute distress ? ?HEENT: atraumatic, conjunttiva clear, no obvious abnormalities on inspection of external nose and ears ? ?NECK: normal movements of the head and neck ? ?LUNGS: on inspection no signs of respiratory distress, breathing rate appears normal, no obvious gross SOB, gasping or wheezing ? ?CV: no obvious cyanosis ? ?MS: moves all visible extremities without noticeable abnormality ? ?PSYCH/NEURO:  pleasant and cooperative, no obvious depression or anxiety, speech and thought processing grossly intact ?Lab Results  ?Component Value Date  ? WBC 6.1 03/03/2018  ? HGB 12.9 03/03/2018  ? HCT 38.4 03/03/2018  ? PLT 207.0 03/03/2018  ? GLUCOSE 95 03/03/2018  ? CHOL 134 01/12/2014  ? TRIG 92.0 01/12/2014  ? HDL 69.00 01/12/2014  ? LDLCALC 47 01/12/2014  ? ALT 12 03/03/2018  ? AST 14 03/03/2018  ? NA 140 03/03/2018  ? K 4.3 03/03/2018  ? CL 106 03/03/2018  ? CREATININE 0.65 03/03/2018  ? BUN 7 03/03/2018  ? CO2 27 03/03/2018  ? TSH 1.22 01/12/2014  ? ? ?ASSESSMENT AND PLAN: ? ?Discussed the following assessment and plan: ? ?  ICD-10-CM   ?1. Gastroesophageal reflux disease without esophagitis  K21.9 omeprazole (PRILOSEC) 20 MG capsule  ?  ?2. Medication refill  Z76.0   ?  ?3. Anxiety state  F41.1   ?  ?4. Attention and concentration problem  R41.840   ?  ? ?Gerd sx  rapidly improved on ppi but  recurs 3 days off . No other alarm sx and possibly previous antibiotics could have aggravated   (she was on doxy  for recluse spider bite.) ?Will refill omeprazole   after weeks and ok try qod and alternate with famotidine pepcid  to control . Fu if  persistent or progressive  ? ?In regard to attention  eval in 2019  felt from mostly anxiety and depression  but depression is in remission ?Was under care and on adderall 5 xr by psych about 5 years ago?when in [place] . ?Advise see  behavioral health presciber . ?Continue to avoid  sig etoh other SU   and get enough sleep  .  In the interim .  ?Anxiety  on going  not sure  how effecting the total picture.    ? ?Advised seek behavioral health pre scriber to see if    meds or other intervention could be helpful  ?Could be closer to her such as [facility]  .  ?Counseled.  ? Expectant management and discussion of plan and treatment with opportunity to ask questions and all were answered. The patient agreed with the plan and demonstrated an understanding of the  instructions. ?  ?Advised to call back or seek an in-person evaluation if worsening  or having  further concerns  in interim. ?Return for as indicated or med check 6-12 mos . ? ? ? ?Berniece Andreas, MD  ?

## 2021-10-28 ENCOUNTER — Telehealth: Payer: BLUE CROSS/BLUE SHIELD | Admitting: Internal Medicine

## 2021-11-07 ENCOUNTER — Telehealth: Payer: BLUE CROSS/BLUE SHIELD | Admitting: Family Medicine

## 2021-11-07 ENCOUNTER — Encounter: Payer: Self-pay | Admitting: Family Medicine

## 2021-11-07 DIAGNOSIS — J310 Chronic rhinitis: Secondary | ICD-10-CM | POA: Diagnosis not present

## 2021-11-07 DIAGNOSIS — R051 Acute cough: Secondary | ICD-10-CM | POA: Diagnosis not present

## 2021-11-07 MED ORDER — BENZONATATE 100 MG PO CAPS: 100.0000 mg | ORAL_CAPSULE | Freq: Three times a day (TID) | ORAL | 0 refills | Status: DC | PRN

## 2021-11-07 NOTE — Progress Notes (Signed)
We are sorry that you are not feeling well.  Here is how we plan to help!  Based on your presentation I believe you most likely have A cough due to a virus.  This is called viral bronchitis and is best treated by rest, plenty of fluids and control of the cough.  You may use Ibuprofen or Tylenol as directed to help your symptoms.     In addition you may use A prescription cough medication called Tessalon Perles 100mg. You may take 1-2 capsules every 8 hours as needed for your cough.    From your responses in the eVisit questionnaire you describe inflammation in the upper respiratory tract which is causing a significant cough.  This is commonly called Bronchitis and has four common causes:   Allergies Viral Infections Acid Reflux Bacterial Infection Allergies, viruses and acid reflux are treated by controlling symptoms or eliminating the cause. An example might be a cough caused by taking certain blood pressure medications. You stop the cough by changing the medication. Another example might be a cough caused by acid reflux. Controlling the reflux helps control the cough.  USE OF BRONCHODILATOR ("RESCUE") INHALERS: There is a risk from using your bronchodilator too frequently.  The risk is that over-reliance on a medication which only relaxes the muscles surrounding the breathing tubes can reduce the effectiveness of medications prescribed to reduce swelling and congestion of the tubes themselves.  Although you feel brief relief from the bronchodilator inhaler, your asthma may actually be worsening with the tubes becoming more swollen and filled with mucus.  This can delay other crucial treatments, such as oral steroid medications. If you need to use a bronchodilator inhaler daily, several times per day, you should discuss this with your provider.  There are probably better treatments that could be used to keep your asthma under control.     HOME CARE Only take medications as instructed by your  medical team. Complete the entire course of an antibiotic. Drink plenty of fluids and get plenty of rest. Avoid close contacts especially the very young and the elderly Cover your mouth if you cough or cough into your sleeve. Always remember to wash your hands A steam or ultrasonic humidifier can help congestion.   GET HELP RIGHT AWAY IF: You develop worsening fever. You become short of breath You cough up blood. Your symptoms persist after you have completed your treatment plan MAKE SURE YOU  Understand these instructions. Will watch your condition. Will get help right away if you are not doing well or get worse.    Thank you for choosing an e-visit.  Your e-visit answers were reviewed by a board certified advanced clinical practitioner to complete your personal care plan. Depending upon the condition, your plan could have included both over the counter or prescription medications.  Please review your pharmacy choice. Make sure the pharmacy is open so you can pick up prescription now. If there is a problem, you may contact your provider through MyChart messaging and have the prescription routed to another pharmacy.  Your safety is important to us. If you have drug allergies check your prescription carefully.   For the next 24 hours you can use MyChart to ask questions about today's visit, request a non-urgent call back, or ask for a work or school excuse. You will get an email in the next two days asking about your experience. I hope that your e-visit has been valuable and will speed your recovery.]  I provided 5   minutes of non face-to-face time during this encounter for chart review, medication and order placement, as well as and documentation.   

## 2021-12-10 ENCOUNTER — Telehealth: Payer: BLUE CROSS/BLUE SHIELD | Admitting: Physician Assistant

## 2021-12-10 DIAGNOSIS — J310 Chronic rhinitis: Secondary | ICD-10-CM | POA: Diagnosis not present

## 2021-12-10 MED ORDER — BENZONATATE 100 MG PO CAPS: 100.0000 mg | ORAL_CAPSULE | Freq: Three times a day (TID) | ORAL | 0 refills | Status: DC | PRN

## 2021-12-10 NOTE — Progress Notes (Signed)
E visit for Allergic Rhinitis We are sorry that you are not feeling well.  Here is how we plan to help!  Based on what you have shared with me it looks like you have Allergic Rhinitis.  Rhinitis is when a reaction occurs that causes nasal congestion, runny nose, sneezing, and itching.  Most types of rhinitis are caused by an inflammation and are associated with symptoms in the eyes ears or throat. There are several types of rhinitis.  The most common are acute rhinitis, which is usually caused by a viral illness, allergic or seasonal rhinitis, and nonallergic or year-round rhinitis.  Nasal allergies occur certain times of the year.  Allergic rhinitis is caused when allergens in the air trigger the release of histamine in the body.  Histamine causes itching, swelling, and fluid to build up in the fragile linings of the nasal passages, sinuses and eyelids.  An itchy nose and clear discharge are common.  I recommend the following over the counter treatments: You should take a daily dose of antihistamine and Xyzal 5 mg take 1 tablet daily  I also would recommend a nasal spray: Flonase 2 sprays into each nostril once daily and Saline 1 spray into each nostril as needed  I have also sent in a prescription cough medication to the pharmacy to use as directed. I have sent a work note for the day to your MyChart.   HOME CARE:  You can use an over-the-counter saline nasal spray as needed Avoid areas where there is heavy dust, mites, or molds Stay indoors on windy days during the pollen season Keep windows closed in home, at least in bedroom; use air conditioner. Use high-efficiency house air filter Keep windows closed in car, turn AC on re-circulate Avoid playing out with dog during pollen season  GET HELP RIGHT AWAY IF:  If your symptoms do not improve within 10 days You become short of breath You develop yellow or green discharge from your nose for over 3 days You have coughing fits  MAKE SURE  YOU:  Understand these instructions Will watch your condition Will get help right away if you are not doing well or get worse  Thank you for choosing an e-visit. Your e-visit answers were reviewed by a board certified advanced clinical practitioner to complete your personal care plan. Depending upon the condition, your plan could have included both over the counter or prescription medications. Please review your pharmacy choice. Be sure that the pharmacy you have chosen is open so that you can pick up your prescription now.  If there is a problem you may message your provider in MyChart to have the prescription routed to another pharmacy. Your safety is important to [place]. If you have drug allergies check your prescription carefully.  For the next 24 hours, you can use MyChart to ask questions about today's visit, request a non-urgent call back, or ask for a work or school excuse from your e-visit provider. You will get an email in the next two days asking about your experience. I hope that your e-visit has been valuable and will speed your recovery.

## 2021-12-10 NOTE — Progress Notes (Signed)
I have spent 5 minutes in review of e-visit questionnaire, review and updating patient chart, medical decision making and response to patient.   Mia Milan Cody Jacklynn Dehaas, PA-C    

## 2021-12-31 ENCOUNTER — Other Ambulatory Visit: Payer: Self-pay | Admitting: Internal Medicine

## 2021-12-31 DIAGNOSIS — K219 Gastro-esophageal reflux disease without esophagitis: Secondary | ICD-10-CM

## 2021-12-31 NOTE — Telephone Encounter (Signed)
Last Vv 10/24/21 Filled 10/23/21 Is it ok to refill?

## 2022-01-01 ENCOUNTER — Telehealth: Payer: BLUE CROSS/BLUE SHIELD | Admitting: Physician Assistant

## 2022-01-01 DIAGNOSIS — J019 Acute sinusitis, unspecified: Secondary | ICD-10-CM | POA: Diagnosis not present

## 2022-01-01 DIAGNOSIS — B9689 Other specified bacterial agents as the cause of diseases classified elsewhere: Secondary | ICD-10-CM

## 2022-01-01 MED ORDER — AMOXICILLIN-POT CLAVULANATE 875-125 MG PO TABS: 1.0000 | ORAL_TABLET | Freq: Two times a day (BID) | ORAL | 0 refills | Status: DC

## 2022-01-01 NOTE — Progress Notes (Signed)
I have spent 5 minutes in review of e-visit questionnaire, review and updating patient chart, medical decision making and response to patient.   Jasneet Schobert Cody Delcenia Inman, PA-C    

## 2022-01-01 NOTE — Progress Notes (Signed)
E-Visit for Sinus Problems  We are sorry that you are not feeling well.  Here is how we plan to help!  Based on what you have shared with me it looks like you have sinusitis.  Sinusitis is inflammation and infection in the sinus cavities of the head.  Based on your presentation I believe you most likely have Acute Bacterial Sinusitis.  This is an infection caused by bacteria and is treated with antibiotics. I have prescribed Augmentin 875mg /125mg  one tablet twice daily with food, for 7 days. You may use an oral decongestant such as Mucinex D or if you have glaucoma or high blood pressure use plain Mucinex. Saline nasal spray help and can safely be used as often as needed for congestion.  If you develop worsening sinus pain, fever or notice severe headache and vision changes, or if symptoms are not better after completion of antibiotic, please schedule an appointment with a health care provider.    Sinus infections are not as easily transmitted as other respiratory infection, however we still recommend that you avoid close contact with loved ones, especially the very young and elderly.  Remember to wash your hands thoroughly throughout the day as this is the number one way to prevent the spread of infection!  I will send a work note to your MyChart momentarily  Home Care: Only take medications as instructed by your medical team. Complete the entire course of an antibiotic. Do not take these medications with alcohol. A steam or ultrasonic humidifier can help congestion.  You can place a towel over your head and breathe in the steam from hot water coming from a faucet. Avoid close contacts especially the very young and the elderly. Cover your mouth when you cough or sneeze. Always remember to wash your hands.  Get Help Right Away If: You develop worsening fever or sinus pain. You develop a severe head ache or visual changes. Your symptoms persist after you have completed your treatment  plan.  Make sure you Understand these instructions. Will watch your condition. Will get help right away if you are not doing well or get worse.  Thank you for choosing an e-visit.  Your e-visit answers were reviewed by a board certified advanced clinical practitioner to complete your personal care plan. Depending upon the condition, your plan could have included both over the counter or prescription medications.  Please review your pharmacy choice. Make sure the pharmacy is open so you can pick up prescription now. If there is a problem, you may contact your provider through and have the prescription routed to another pharmacy.  Your safety is important to [employer]. If you have drug allergies check your prescription carefully.   For the next 24 hours you can use MyChart to ask questions about today's visit, request a non-urgent call back, or ask for a work or school excuse. You will get an email in the next two days asking about your experience. I hope that your e-visit has been valuable and will speed your recovery.

## 2022-01-27 ENCOUNTER — Telehealth: Payer: BLUE CROSS/BLUE SHIELD | Admitting: Family Medicine

## 2022-01-27 DIAGNOSIS — T300 Burn of unspecified body region, unspecified degree: Secondary | ICD-10-CM | POA: Diagnosis not present

## 2022-01-27 MED ORDER — MUPIROCIN 2 % EX OINT: 1.0000 | TOPICAL_OINTMENT | Freq: Two times a day (BID) | CUTANEOUS | 0 refills | Status: AC
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
## 2022-02-25 ENCOUNTER — Telehealth: Payer: BLUE CROSS/BLUE SHIELD | Admitting: Family Medicine

## 2022-02-25 ENCOUNTER — Other Ambulatory Visit: Payer: Self-pay | Admitting: Internal Medicine

## 2022-02-25 ENCOUNTER — Encounter: Payer: Self-pay | Admitting: Family Medicine

## 2022-02-25 DIAGNOSIS — R112 Nausea with vomiting, unspecified: Secondary | ICD-10-CM | POA: Diagnosis not present

## 2022-02-25 DIAGNOSIS — K219 Gastro-esophageal reflux disease without esophagitis: Secondary | ICD-10-CM

## 2022-02-25 MED ORDER — ONDANSETRON HCL 4 MG PO TABS: 4.0000 mg | ORAL_TABLET | Freq: Three times a day (TID) | ORAL | 0 refills | Status: DC | PRN

## 2022-02-25 NOTE — Progress Notes (Signed)
E-Visit for Nausea and Vomiting   We are sorry that you are not feeling well. Here is how we plan to help!  Based on what you have shared with me it looks like you have a Virus that is irritating your GI tract.  Vomiting is the forceful emptying of a portion of the stomach's content through the mouth.  Although nausea and vomiting can make you feel miserable, it's important to remember that these are not diseases, but rather symptoms of an underlying illness.  When we treat short term symptoms, we always caution that any symptoms that persist should be fully evaluated in a medical office.  I have prescribed a medication that will help alleviate your symptoms and allow you to stay hydrated:  Zofran 4 mg 1 tablet every 8 hours as needed for nausea and vomiting  HOME CARE: Drink clear liquids.  This is very important! Dehydration (the lack of fluid) can lead to a serious complication.  Start off with 1 tablespoon every 5 minutes for 8 hours. You may begin eating bland foods after 8 hours without vomiting.  Start with saltine crackers, white bread, rice, mashed potatoes, applesauce. After 48 hours on a bland diet, you may resume a normal diet. Try to go to sleep.  Sleep often empties the stomach and relieves the need to vomit.  GET HELP RIGHT AWAY IF:  Your symptoms do not improve or worsen within 2 days after treatment. You have a fever for over 3 days. You cannot keep down fluids after trying the medication.  MAKE SURE YOU:  Understand these instructions. Will watch your condition. Will get help right away if you are not doing well or get worse.    Thank you for choosing an e-visit.  Your e-visit answers were reviewed by a board certified advanced clinical practitioner to complete your personal care plan. Depending upon the condition, your plan could have included both over the counter or prescription medications.  Please review your pharmacy choice. Make sure the pharmacy is open so  you can pick up prescription now. If there is a problem, you may contact your provider through MyChart messaging and have the prescription routed to another pharmacy.  Your safety is important to us. If you have drug allergies check your prescription carefully.   For the next 24 hours you can use MyChart to ask questions about today's visit, request a non-urgent call back, or ask for a work or school excuse. You will get an email in the next two days asking about your experience. I hope that your e-visit has been valuable and will speed your recovery.  I provided 5 minutes of non face-to-face time during this encounter for chart review, medication and order placement, as well as and documentation.   

## 2022-02-26 ENCOUNTER — Other Ambulatory Visit: Payer: Self-pay | Admitting: Internal Medicine

## 2022-03-12 ENCOUNTER — Other Ambulatory Visit: Payer: Self-pay | Admitting: Internal Medicine

## 2022-04-13 ENCOUNTER — Telehealth: Payer: BLUE CROSS/BLUE SHIELD | Admitting: Family

## 2022-04-13 DIAGNOSIS — R112 Nausea with vomiting, unspecified: Secondary | ICD-10-CM

## 2022-04-13 DIAGNOSIS — R197 Diarrhea, unspecified: Secondary | ICD-10-CM | POA: Diagnosis not present

## 2022-04-13 MED ORDER — ONDANSETRON HCL 4 MG PO TABS: 4.0000 mg | ORAL_TABLET | Freq: Three times a day (TID) | ORAL | 0 refills | Status: DC | PRN

## 2022-04-13 NOTE — Progress Notes (Signed)
E-Visit for Vomiting  We are sorry that you are not feeling well. Here is how we plan to help!  Based on what you have shared with me it looks like you have vomiting and diarrhea.   I have prescribed a medication that will help alleviate your symptoms and allow you to stay hydrated:  Zofran 4 mg 1 tablet every 8 hours as needed for nausea and vomiting.  I recommend taking the zofran around the clock for the next 24 hours. Make sure to drink lots of fluids, if no improvement, you need to be seen in person to be evaluated.   HOME CARE: Drink clear liquids.  This is very important! Dehydration (the lack of fluid) can lead to a serious complication.  Start off with 1 tablespoon every 5 minutes for 8 hours. You may begin eating bland foods after 8 hours without vomiting.  Start with saltine crackers, white bread, rice, mashed potatoes, applesauce. After 48 hours on a bland diet, you may resume a normal diet. Try to go to sleep.  Sleep often empties the stomach and relieves the need to vomit.  GET HELP RIGHT AWAY IF:  Your symptoms do not improve or worsen within 2 days after treatment. You have a fever for over 3 days. You cannot keep down fluids after trying the medication.  MAKE SURE YOU:  Understand these instructions. Will watch your condition. Will get help right away if you are not doing well or get worse.   Thank you for choosing an e-visit.  Your e-visit answers were reviewed by a board certified advanced clinical practitioner to complete your personal care plan. Depending upon the condition, your plan could have included both over the counter or prescription medications.  Please review your pharmacy choice. Make sure the pharmacy is open so you can pick up prescription now. If there is a problem, you may contact your provider through [employer] and have the prescription routed to another pharmacy.  Your safety is important to [place]. If you have drug allergies check your  prescription carefully.   For the next 24 hours you can use MyChart to ask questions about today's visit, request a non-urgent call back, or ask for a work or school excuse. You will get an email in the next two days asking about your experience. I hope that your e-visit has been valuable and will speed your recovery.   Approximately 5 minutes was spent documenting and reviewing patient's chart.

## 2022-04-14 ENCOUNTER — Other Ambulatory Visit: Payer: Self-pay | Admitting: Internal Medicine

## 2022-04-14 DIAGNOSIS — K219 Gastro-esophageal reflux disease without esophagitis: Secondary | ICD-10-CM

## 2022-05-13 ENCOUNTER — Telehealth: Payer: BLUE CROSS/BLUE SHIELD | Admitting: Nurse Practitioner

## 2022-05-13 DIAGNOSIS — J069 Acute upper respiratory infection, unspecified: Secondary | ICD-10-CM

## 2022-05-13 MED ORDER — FLUTICASONE PROPIONATE 50 MCG/ACT NA SUSP: 2.0000 | Freq: Every day | NASAL | 6 refills | Status: DC

## 2022-05-13 NOTE — Progress Notes (Signed)
E-Visit for Sinus Problems  We are sorry that you are not feeling well.  Here is how we plan to help!  Based on what you have shared with me it looks like you have sinusitis.  Sinusitis is inflammation and infection in the sinus cavities of the head.  Based on your presentation I believe you most likely have Acute Viral Sinusitis.This is an infection most likely caused by a virus. There is not specific treatment for viral sinusitis other than to help you with the symptoms until the infection runs its course.  You may use an oral decongestant such as Mucinex D or if you have glaucoma or high blood pressure use plain Mucinex. Saline nasal spray help and can safely be used as often as needed for congestion, I have prescribed: Fluticasone nasal spray two sprays in each nostril once a day.  Providers prescribe antibiotics to treat infections caused by bacteria. Antibiotics are very powerful in treating bacterial infections when they are used properly. To maintain their effectiveness, they should be used only when necessary. Overuse of antibiotics has resulted in the development of superbugs that are resistant to treatment!    After careful review of your answers, I would not recommend an antibiotic for your condition.  Antibiotics are not effective against viruses and therefore should not be used to treat them. Common examples of infections caused by viruses include colds and flu   Some authorities believe that zinc sprays or the use of Echinacea may shorten the course of your symptoms.  Sinus infections are not as easily transmitted as other respiratory infection, however we still recommend that you avoid close contact with loved ones, especially the very young and elderly.  Remember to wash your hands thoroughly throughout the day as this is the number one way to prevent the spread of infection!  Home Care: Only take medications as instructed by your medical team. Do not take these medications with  alcohol. A steam or ultrasonic humidifier can help congestion.  You can place a towel over your head and breathe in the steam from hot water coming from a faucet. Avoid close contacts especially the very young and the elderly. Cover your mouth when you cough or sneeze. Always remember to wash your hands.  Get Help Right Away If: You develop worsening fever or sinus pain. You develop a severe head ache or visual changes. Your symptoms persist after you have completed your treatment plan.  Make sure you Understand these instructions. Will watch your condition. Will get help right away if you are not doing well or get worse.   Thank you for choosing an e-visit.  Your e-visit answers were reviewed by a board certified advanced clinical practitioner to complete your personal care plan. Depending upon the condition, your plan could have included both over the counter or prescription medications.  Please review your pharmacy choice. Make sure the pharmacy is open so you can pick up prescription now. If there is a problem, you may contact your provider through MyChart messaging and have the prescription routed to another pharmacy.  Your safety is important to us. If you have drug allergies check your prescription carefully.   For the next 24 hours you can use MyChart to ask questions about today's visit, request a non-urgent call back, or ask for a work or school excuse. You will get an email in the next two days asking about your experience. I hope that your e-visit has been valuable and will speed your recovery.    Meds ordered this encounter  Medications   fluticasone (FLONASE) 50 MCG/ACT nasal spray    Sig: Place 2 sprays into both nostrils daily.    Dispense:  16 g    Refill:  6    I spent approximately 5 minutes reviewing the patient's history, current symptoms and coordinating their plan of care today.   

## 2022-05-25 ENCOUNTER — Telehealth: Payer: BLUE CROSS/BLUE SHIELD | Admitting: Physician Assistant

## 2022-05-25 DIAGNOSIS — J019 Acute sinusitis, unspecified: Secondary | ICD-10-CM | POA: Diagnosis not present

## 2022-05-25 DIAGNOSIS — B9689 Other specified bacterial agents as the cause of diseases classified elsewhere: Secondary | ICD-10-CM

## 2022-05-25 MED ORDER — AMOXICILLIN-POT CLAVULANATE 875-125 MG PO TABS: 1.0000 | ORAL_TABLET | Freq: Two times a day (BID) | ORAL | 0 refills | Status: DC

## 2022-05-25 NOTE — Progress Notes (Signed)

## 2022-06-07 ENCOUNTER — Telehealth: Payer: Self-pay | Admitting: Urgent Care

## 2022-06-07 DIAGNOSIS — K120 Recurrent oral aphthae: Secondary | ICD-10-CM

## 2022-06-07 MED ORDER — TRIAMCINOLONE ACETONIDE 0.1 % MT PSTE: 1.0000 | PASTE | Freq: Three times a day (TID) | OROMUCOSAL | 0 refills | Status: AC

## 2022-06-07 MED ORDER — CHLORHEXIDINE GLUCONATE 0.12 % MT SOLN: 5.0000 mL | Freq: Two times a day (BID) | OROMUCOSAL | 0 refills | Status: AC

## 2022-06-07 NOTE — Progress Notes (Signed)

## 2022-06-09 ENCOUNTER — Other Ambulatory Visit: Payer: Self-pay | Admitting: Internal Medicine

## 2022-06-09 NOTE — Telephone Encounter (Signed)
Last visit was VV on 10/24/21 Next OV: none scheduled

## 2022-06-10 ENCOUNTER — Other Ambulatory Visit: Payer: Self-pay | Admitting: Internal Medicine

## 2022-06-10 DIAGNOSIS — K219 Gastro-esophageal reflux disease without esophagitis: Secondary | ICD-10-CM

## 2022-06-11 NOTE — Telephone Encounter (Signed)
Last refill-02/27/22--10 tabs, 0 refills Last VV-10/24/21  No future OV scheduled.

## 2022-08-02 ENCOUNTER — Other Ambulatory Visit: Payer: Self-pay | Admitting: Family

## 2022-08-11 ENCOUNTER — Other Ambulatory Visit: Payer: Self-pay | Admitting: Internal Medicine

## 2022-08-11 DIAGNOSIS — K219 Gastro-esophageal reflux disease without esophagitis: Secondary | ICD-10-CM

## 2022-08-12 ENCOUNTER — Telehealth: Payer: BLUE CROSS/BLUE SHIELD | Admitting: Nurse Practitioner

## 2022-08-12 DIAGNOSIS — K121 Other forms of stomatitis: Secondary | ICD-10-CM | POA: Diagnosis not present

## 2022-08-12 MED ORDER — CHLORHEXIDINE GLUCONATE 0.12 % MT SOLN: 15.0000 mL | Freq: Two times a day (BID) | OROMUCOSAL | 0 refills | Status: DC

## 2022-08-12 NOTE — Progress Notes (Signed)
E-Visit for Mouth Ulcers  We are sorry that you are not feeling well.  Here is how we plan to help!  Based on what you have shared with me, it appears that you do have mouth ulcer(s).     The following medications should decrease the discomfort and help with healing. We recommend a Multivitamin daily and a prescription for :Chlorhexidine mouthwash daily for 3 days   You should also switch your toothpaste to a TOMs or other toothpaste that DOES NOT contain  Sodium lauryl sulfate (SLS) as this continues to irritate the area and prevents healing.   Switch from ibuprofen to extra strength tylenol.  You can also use over the counter Orajel to numb the area for relief   Mouth ulcers are painful areas in the mouth and gums. These are also known as "canker sores".  They can occur anywhere inside the mouth. While mostly harmless, mouth ulcers can be extremely uncomfortable and may make it difficult to eat, drink, and brush your teeth.  You may have more than 1 ulcer and they can vary and change in size. Mouth ulcers are not contagious and should not be confused with cold sores.  Cold sores appear on the lip or around the outside of the mouth and often begin with a tingling, burning or itching sensation.   While the exact causes are unknown, some common causes and factors that may aggravate mouth ulcers include: Genetics - Sometimes mouth ulcers run in families High alcohol intake Acidic foods such as citrus fruits like pineapple, grapefruit, orange fruits/juices, may aggravate mouth ulcers Other foods high in acidity or spice such as coffee, chocolate, chips, pretzels, eggs, nuts, cheese Quitting smoking Injury caused by biting the tongue or inside of the cheek Diet lacking in 0000000, zinc, folic acid or iron Female hormone shifts with menstruation Excessive fatigue, emotional stress or anxiety Prevention: Talk to your doctor if you are taking meds that are known to cause mouth ulcers such as:    Anti-inflammatory drugs (for example Ibuprofen, Naproxen sodium), pain killers, Beta blockers, Oral nicotine replacement drugs, Some street drugs (heroin).   Avoid allowing any tablets to dissolve in your mouth that are meant to swallowed whole Avoid foods/drinks that trigger or worsen symptoms Keep your mouth clean with daily brushing and flossing  Home Care: The goal with treatment is to ease the pain where ulcers occur and help them heal as quickly as possible.  There is no medical treatment to prevent mouth ulcers from coming back or recurring.  Avoid spicy and acidic foods Eat soft foods and avoid rough, crunchy foods Avoid chewing gum Do not use toothpaste that contains sodium lauryl sulphite Use a straw to drink which helps avoid liquids toughing the ulcers near the front of your mouth Use a very soft toothbrush If you have dentures or dental hardware that you feel is not fitting well or contributing to his, please see your dentist. Use saltwater mouthwash which helps healing. Dissolve a  teaspoon of salt in a glass of warm water. Swish around your mouth and spit it out. This can be used as needed if it is soothing.   GET HELP RIGHT AWAY IF: Persistent ulcers require checking IN PERSON (face to face). Any mouth lesion lasting longer than a month should be seen by your DENTIST as soon as possible for evaluation for possible oral cancer. If you have a non-painful ulcer in 1 or more areas of your mouth Ulcers that are spreading, are very  large or particularly painful Ulcers last longer than one week without improving on treatment If you develop a fever, swollen glands and begin to feel unwell Ulcers that developed after starting a new medication MAKE SURE YOU: Understand these instructions. Will watch your condition. Will get help right away if you are not doing well or get worse.  Thank you for choosing an e-visit.  Your e-visit answers were reviewed by a board certified advanced  clinical practitioner to complete your personal care plan. Depending upon the condition, your plan could have included both over the counter or prescription medications.  Please review your pharmacy choice. Make sure the pharmacy is open so you can pick up prescription now. If there is a problem, you may contact your provider through [employer] and have the prescription routed to another pharmacy.  Your safety is important to [place]. If you have drug allergies check your prescription carefully.   For the next 24 hours you can use MyChart to ask questions about today's visit, request a non-urgent call back, or ask for a work or school excuse. You will get an email in the next two days asking about your experience. I hope that your e-visit has been valuable and will speed your recovery.   Meds ordered this encounter  Medications   chlorhexidine (PERIDEX) 0.12 % solution    Sig: Use as directed 15 mLs in the mouth or throat 2 (two) times daily. Gargle and spit    Dispense:  120 mL    Refill:  0    I spent approximately 5 minutes reviewing the patient's history, current symptoms and coordinating their care today.

## 2022-08-25 ENCOUNTER — Telehealth: Payer: BLUE CROSS/BLUE SHIELD | Admitting: Physician Assistant

## 2022-08-25 DIAGNOSIS — B9689 Other specified bacterial agents as the cause of diseases classified elsewhere: Secondary | ICD-10-CM

## 2022-08-25 DIAGNOSIS — J019 Acute sinusitis, unspecified: Secondary | ICD-10-CM | POA: Diagnosis not present

## 2022-08-25 MED ORDER — AMOXICILLIN-POT CLAVULANATE 875-125 MG PO TABS: 1.0000 | ORAL_TABLET | Freq: Two times a day (BID) | ORAL | 0 refills | Status: DC

## 2022-08-25 NOTE — Progress Notes (Signed)

## 2022-08-26 ENCOUNTER — Other Ambulatory Visit: Payer: Self-pay | Admitting: Family

## 2022-08-28 ENCOUNTER — Other Ambulatory Visit: Payer: Self-pay

## 2022-08-28 NOTE — Telephone Encounter (Signed)
Last video visit:10/24/2021  Please advise.   Attempted to reach pt for follow up appt. Left a voicemail to call us back.

## 2022-08-29 ENCOUNTER — Other Ambulatory Visit: Payer: Self-pay | Admitting: Internal Medicine

## 2022-08-29 ENCOUNTER — Telehealth: Payer: Self-pay | Admitting: Internal Medicine

## 2022-08-29 MED ORDER — CITALOPRAM HYDROBROMIDE 40 MG PO TABS: 40.0000 mg | ORAL_TABLET | Freq: Every day | ORAL | 0 refills | Status: DC

## 2022-08-29 NOTE — Telephone Encounter (Signed)
Pt spilled her medication so she ran out yesterday 08/28/22. She is requesting a refill - she has a Mychart appt on 09/04/22.

## 2022-09-01 NOTE — Telephone Encounter (Signed)
The Rx was already sent in 08/29/2022.  Attempted to reach pt to follow up. Left a voicemail to call back.

## 2022-09-02 NOTE — Telephone Encounter (Signed)
Attempted to reach pt to confirm. Left a voicemail to call us back.

## 2022-09-04 ENCOUNTER — Telehealth: Payer: BLUE CROSS/BLUE SHIELD | Admitting: Internal Medicine

## 2022-09-08 MED ORDER — ALPRAZOLAM 0.5 MG PO TABS: 0.5000 mg | ORAL_TABLET | Freq: Two times a day (BID) | ORAL | 0 refills | Status: DC | PRN

## 2022-09-08 NOTE — Telephone Encounter (Signed)
Was to [facility] a virtual visit  last week  for med check . I will refill x 1  but will need a visit  for further  refill

## 2022-09-11 NOTE — Telephone Encounter (Signed)
Attempted to reach pt to schedule a visit. Left a voicemail to call back.

## 2022-09-17 ENCOUNTER — Other Ambulatory Visit: Payer: Self-pay | Admitting: Internal Medicine

## 2022-09-17 DIAGNOSIS — K219 Gastro-esophageal reflux disease without esophagitis: Secondary | ICD-10-CM

## 2022-09-19 NOTE — Telephone Encounter (Signed)
Contacted pt and left a detail message to give [place] a call to schedule a visit for further refill on xanax.

## 2022-09-24 ENCOUNTER — Telehealth: Payer: Self-pay | Admitting: Internal Medicine

## 2022-10-14 ENCOUNTER — Other Ambulatory Visit: Payer: Self-pay | Admitting: Family

## 2022-10-14 DIAGNOSIS — K219 Gastro-esophageal reflux disease without esophagitis: Secondary | ICD-10-CM

## 2022-10-27 ENCOUNTER — Telehealth: Payer: Self-pay

## 2022-10-27 DIAGNOSIS — K219 Gastro-esophageal reflux disease without esophagitis: Secondary | ICD-10-CM

## 2022-10-27 NOTE — Telephone Encounter (Signed)
Received a Refill Authorization Request from [facility] on Omeprazole 20MG .  Upon looking at pt's chart, pt is due for lab and cpe. Attempt to reach pt. Left a voicemail to call us back.

## 2022-10-31 NOTE — Telephone Encounter (Signed)
Attempted to reach pt. Left a voicemail to call us back.  

## 2022-11-13 ENCOUNTER — Other Ambulatory Visit: Payer: Self-pay | Admitting: Internal Medicine

## 2022-11-13 ENCOUNTER — Telehealth: Payer: Self-pay | Admitting: Family Medicine

## 2022-11-13 DIAGNOSIS — K219 Gastro-esophageal reflux disease without esophagitis: Secondary | ICD-10-CM

## 2022-11-13 DIAGNOSIS — J329 Chronic sinusitis, unspecified: Secondary | ICD-10-CM

## 2022-11-13 NOTE — Progress Notes (Signed)
It looks like you are having sinus infections around every 3 months or so Have you been seen by your PCP for this? You might benefit from allergy medications use daily, with a nasal spray and if that does not help a referral to ENT.    I feel your condition warrants further evaluation and I recommend that you be seen for a face to face visit.  Please contact your primary care physician practice to be seen. Many offices offer virtual options to be seen via video if you are not comfortable going in person to a medical facility at this time.  NOTE: You will NOT be charged for this eVisit.

## 2022-11-14 ENCOUNTER — Other Ambulatory Visit: Payer: Self-pay

## 2022-11-14 ENCOUNTER — Other Ambulatory Visit: Payer: Self-pay | Admitting: Family

## 2022-11-14 DIAGNOSIS — K219 Gastro-esophageal reflux disease without esophagitis: Secondary | ICD-10-CM

## 2022-11-14 MED ORDER — OMEPRAZOLE 20 MG PO CPDR: DELAYED_RELEASE_CAPSULE | ORAL | 0 refills | Status: DC

## 2022-11-17 ENCOUNTER — Other Ambulatory Visit: Payer: Self-pay | Admitting: Internal Medicine

## 2022-12-02 ENCOUNTER — Encounter: Payer: Self-pay | Admitting: Internal Medicine

## 2022-12-11 ENCOUNTER — Other Ambulatory Visit: Payer: Self-pay | Admitting: Internal Medicine

## 2022-12-11 ENCOUNTER — Encounter: Payer: Self-pay | Admitting: Internal Medicine

## 2022-12-11 ENCOUNTER — Telehealth (INDEPENDENT_AMBULATORY_CARE_PROVIDER_SITE_OTHER): Payer: Self-pay | Admitting: Internal Medicine

## 2022-12-11 VITALS — Ht 63.0 in | Wt 184.0 lb

## 2022-12-11 DIAGNOSIS — Z79899 Other long term (current) drug therapy: Secondary | ICD-10-CM

## 2022-12-11 DIAGNOSIS — Z13220 Encounter for screening for lipoid disorders: Secondary | ICD-10-CM

## 2022-12-11 DIAGNOSIS — R635 Abnormal weight gain: Secondary | ICD-10-CM

## 2022-12-11 NOTE — Progress Notes (Signed)
Future orders  placed  patient to make lab appt m

## 2022-12-11 NOTE — Progress Notes (Signed)
Virtual Visit via Video Note  I connected with Kathleen Hayes on 12/11/22 at 11:45 AM EDT by a video enabled telemedicine application and verified that I am speaking with the correct person using two identifiers. Location patient: home Location provider:work office Persons participating in the virtual visit: patient, provider   Patient aware  of the limitations of evaluation and management by telemedicine and  availability of in person appointments. and agreed to proceed.   HPI: Kathleen Hayes presents for video visit  inperson changed to video visit cause of location Doing well and gained weight in last year  even though working out.  40 + pounds in last 1+ year  no change in meds and fels well otherwise  Friend was on phentermine and was helpful  interested in help . Other options ?  Has been working out and now perparing own foods.  Neg td   Anxiety is well controlled on citalopram  42 for a while and reluctant to stop   or change as has been using since HS and has done well.  Using omeprazole with success but if stops gets sig heart burn.   ROS: See pertinent positives and negatives per HPI. No cv pulm new gi sx   Sleep good no osa sx   Past Medical History:  Diagnosis Date   Concussion    HEAD TRAUMA, CLOSED 06/08/2007   Qualifier: Diagnosis of  By: Fabian Sharp MD, Neta Mends    History of chlamydia 12/28/2012   Shingles     Past Surgical History:  Procedure Laterality Date   ADENOIDECTOMY  age 22   MYRINGOTOMY  84 months   WISDOM TOOTH EXTRACTION      Family History  Problem Relation Age of Onset   Mitral valve prolapse Father    Hypertension Father    Cancer Maternal Grandfather        PANCREATIC   Cancer Paternal Grandfather        BRAIN TUMOR   Hypertension Paternal Grandfather     Social History   Tobacco Use   Smoking status: Never   Smokeless tobacco: Never  Vaping Use   Vaping Use: Never used  Substance Use Topics   Alcohol use: Yes     Alcohol/week: 0.0 standard drinks of alcohol    Comment: Rare   Drug use: No      Current Outpatient Medications:    cetirizine (ZYRTEC) 10 MG tablet, Take 10 mg by mouth daily., Disp: , Rfl:    citalopram (CELEXA) 40 MG tablet, Take 1 tablet by mouth once daily, Disp: 90 tablet, Rfl: 0   norgestimate-ethinyl estradiol (SPRINTEC 28) 0.25-35 MG-MCG tablet, Take 1 tablet by mouth daily., Disp: 3 Package, Rfl: 4   omeprazole (PRILOSEC) 20 MG capsule, TAKE 1 CAPSULE BY MOUTH ONCE DAILY . APPOINTMENT REQUIRED FOR FUTURE REFILLS, Disp: 30 capsule, Rfl: 0  EXAM: BP Readings from Last 3 Encounters:  05/04/18 (!) 98/50  03/03/18 102/62  03/02/18 110/80   Wt Readings from Last 3 Encounters:  12/11/22 184 lb (83.5 kg)  01/31/21 145 lb (65.8 kg)  05/04/18 126 lb 6.4 oz (57.3 kg)     VITALS per patient if applicable:  GENERAL: alert, oriented, appears well and in no acute distress  HEENT: atraumatic, conjunttiva clear, no obvious abnormalities on inspection of external nose and ears  NECK: normal movements of the head and neck  LUNGS: on inspection no signs of respiratory distress, breathing rate appears normal, no obvious gross SOB, gasping or wheezing  CV: no obvious cyanosis  MS: moves all visible extremities without noticeable abnormality  PSYCH/NEURO: pleasant and cooperative, no obvious depression or anxiety, speech and thought processing grossly intact Lab Results  Component Value Date   WBC 6.1 03/03/2018   HGB 12.9 03/03/2018   HCT 38.4 03/03/2018   PLT 207.0 03/03/2018   GLUCOSE 95 03/03/2018   CHOL 134 01/12/2014   TRIG 92.0 01/12/2014   HDL 69.00 01/12/2014   LDLCALC 47 01/12/2014   ALT 12 03/03/2018   AST 14 03/03/2018   NA 140 03/03/2018   K 4.3 03/03/2018   CL 106 03/03/2018   CREATININE 0.65 03/03/2018   BUN 7 03/03/2018   CO2 27 03/03/2018   TSH 1.22 01/12/2014    ASSESSMENT AND PLAN:  Discussed the following assessment and plan:    ICD-10-CM    1. Abnormal weight gain  R63.5     2. Medication management  Z79.899     Last I'm person visit was  2019 and has been rx with antibiotic off and on for recurrent sinusitis   Advise in person visit  ( comes to [facility] about once a month) and will plan  transfer of care eventually to local medical home . Disc poss ofr citalopram dosing contributing but not primary since she has taken for a while.  Fasting lab  vs and  weight and will send in phentermine to her local pharmacy to start . She is anxious to get some help soon.  Do not think risk of anxiety se is significant at this time but related to patient.  If not with  new pcp then   in person visit  exam .  Will eventually have medical home    close to her residence . And TOC.  Counseled.   Expectant management and discussion of plan and treatment with opportunity to ask questions and all were answered. The patient agreed with the plan and demonstrated an understanding of the instructions.   Advised to call back or seek an in-person evaluation if worsening  or having  further concerns  in interim. Return for fasting lab send in bp and weight VS and then fu in person visit.    Berniece Andreas, MD

## 2022-12-12 ENCOUNTER — Other Ambulatory Visit: Payer: Self-pay | Admitting: Internal Medicine

## 2022-12-12 ENCOUNTER — Other Ambulatory Visit (INDEPENDENT_AMBULATORY_CARE_PROVIDER_SITE_OTHER): Payer: Self-pay

## 2022-12-12 DIAGNOSIS — R635 Abnormal weight gain: Secondary | ICD-10-CM

## 2022-12-12 DIAGNOSIS — Z79899 Other long term (current) drug therapy: Secondary | ICD-10-CM

## 2022-12-12 DIAGNOSIS — K219 Gastro-esophageal reflux disease without esophagitis: Secondary | ICD-10-CM

## 2022-12-12 DIAGNOSIS — Z13220 Encounter for screening for lipoid disorders: Secondary | ICD-10-CM

## 2022-12-12 LAB — CBC WITH DIFFERENTIAL/PLATELET
Basophils Absolute: 0 10*3/uL (ref 0.0–0.1)
Basophils Relative: 0.4 % (ref 0.0–3.0)
Eosinophils Absolute: 0.2 10*3/uL (ref 0.0–0.7)
Eosinophils Relative: 3 % (ref 0.0–5.0)
HCT: 41 % (ref 36.0–46.0)
Hemoglobin: 13.5 g/dL (ref 12.0–15.0)
Lymphocytes Relative: 30.8 % (ref 12.0–46.0)
Lymphs Abs: 1.9 10*3/uL (ref 0.7–4.0)
MCHC: 32.9 g/dL (ref 30.0–36.0)
MCV: 91.7 fl (ref 78.0–100.0)
Monocytes Absolute: 0.4 10*3/uL (ref 0.1–1.0)
Monocytes Relative: 7.2 % (ref 3.0–12.0)
Neutro Abs: 3.5 10*3/uL (ref 1.4–7.7)
Neutrophils Relative %: 58.6 % (ref 43.0–77.0)
Platelets: 294 10*3/uL (ref 150.0–400.0)
RBC: 4.47 Mil/uL (ref 3.87–5.11)
RDW: 12.7 % (ref 11.5–15.5)
WBC: 6 10*3/uL (ref 4.0–10.5)

## 2022-12-12 LAB — LDL CHOLESTEROL, DIRECT: Direct LDL: 81 mg/dL

## 2022-12-12 LAB — LIPID PANEL
Cholesterol: 151 mg/dL (ref 0–200)
HDL: 52.8 mg/dL (ref >39.00)
NonHDL: 98.03
Total CHOL/HDL Ratio: 3
Triglycerides: 280 mg/dL — ABNORMAL HIGH (ref 0.0–149.0)
VLDL: 56 mg/dL — ABNORMAL HIGH (ref 0.0–40.0)

## 2022-12-12 LAB — BASIC METABOLIC PANEL
BUN: 10 mg/dL (ref 6–23)
CO2: 27 mEq/L (ref 19–32)
Calcium: 9.1 mg/dL (ref 8.4–10.5)
Chloride: 103 mEq/L (ref 96–112)
Creatinine, Ser: 0.52 mg/dL (ref 0.40–1.20)
GFR: 124.76 mL/min (ref >60.00)
Glucose, Bld: 91 mg/dL (ref 70–99)
Potassium: 4.1 mEq/L (ref 3.5–5.1)
Sodium: 137 mEq/L (ref 135–145)

## 2022-12-12 LAB — TSH: TSH: 1.23 u[IU]/mL (ref 0.35–5.50)

## 2022-12-12 LAB — HEPATIC FUNCTION PANEL
ALT: 15 U/L (ref 0–35)
AST: 16 U/L (ref 0–37)
Albumin: 3.9 g/dL (ref 3.5–5.2)
Alkaline Phosphatase: 60 U/L (ref 39–117)
Bilirubin, Direct: 0 mg/dL (ref 0.0–0.3)
Total Bilirubin: 0.2 mg/dL (ref 0.2–1.2)
Total Protein: 7.1 g/dL (ref 6.0–8.3)

## 2022-12-12 LAB — HEMOGLOBIN A1C: Hgb A1c MFr Bld: 5.4 % (ref 4.6–6.5)

## 2022-12-12 LAB — T4, FREE: Free T4: 0.69 ng/dL (ref 0.60–1.60)

## 2022-12-12 MED ORDER — OMEPRAZOLE 20 MG PO CPDR: DELAYED_RELEASE_CAPSULE | ORAL | 0 refills | Status: DC

## 2022-12-15 ENCOUNTER — Other Ambulatory Visit: Payer: Self-pay | Admitting: Internal Medicine

## 2022-12-15 ENCOUNTER — Encounter: Payer: Self-pay | Admitting: Internal Medicine

## 2022-12-15 NOTE — Progress Notes (Signed)
Triglycerides were up some  no diabetes and thyroid normal . I will send in starting dose of phentermine that we discussed .  Continue lifestyle intervention healthy eating and exercise .

## 2022-12-16 MED ORDER — PHENTERMINE HCL 37.5 MG PO CAPS: 37.5000 mg | ORAL_CAPSULE | ORAL | 0 refills | Status: DC

## 2022-12-16 NOTE — Progress Notes (Signed)
Rx was sent  

## 2022-12-16 NOTE — Progress Notes (Signed)
Pt stated that she needs it to go to [facility] [address] [place], [place] - [address]

## 2022-12-17 NOTE — Progress Notes (Signed)
Noted  

## 2022-12-17 NOTE — Telephone Encounter (Signed)
Sent yesterday

## 2023-01-09 ENCOUNTER — Other Ambulatory Visit: Payer: Self-pay | Admitting: Family

## 2023-01-09 DIAGNOSIS — K219 Gastro-esophageal reflux disease without esophagitis: Secondary | ICD-10-CM

## 2023-01-12 ENCOUNTER — Other Ambulatory Visit: Payer: Self-pay | Admitting: Internal Medicine

## 2023-01-13 ENCOUNTER — Other Ambulatory Visit: Payer: Self-pay | Admitting: Internal Medicine

## 2023-01-14 ENCOUNTER — Other Ambulatory Visit: Payer: Self-pay

## 2023-01-14 DIAGNOSIS — K219 Gastro-esophageal reflux disease without esophagitis: Secondary | ICD-10-CM

## 2023-01-14 MED ORDER — PHENTERMINE HCL 37.5 MG PO TABS: 37.5000 mg | ORAL_TABLET | Freq: Every day | ORAL | 0 refills | Status: DC

## 2023-01-14 MED ORDER — OMEPRAZOLE 20 MG PO CPDR
DELAYED_RELEASE_CAPSULE | ORAL | 0 refills | Status: DC
Start: 2023-01-14 — End: 2023-02-17

## 2023-01-14 NOTE — Telephone Encounter (Signed)
Sent in tab instead of capsule

## 2023-02-04 ENCOUNTER — Telehealth: Payer: Self-pay | Admitting: Physician Assistant

## 2023-02-04 DIAGNOSIS — B9689 Other specified bacterial agents as the cause of diseases classified elsewhere: Secondary | ICD-10-CM

## 2023-02-04 DIAGNOSIS — J028 Acute pharyngitis due to other specified organisms: Secondary | ICD-10-CM

## 2023-02-04 MED ORDER — AMOXICILLIN 500 MG PO CAPS
500.0000 mg | ORAL_CAPSULE | Freq: Two times a day (BID) | ORAL | 0 refills | Status: AC
Start: 2023-02-04 — End: 2023-02-14

## 2023-02-04 NOTE — Progress Notes (Signed)

## 2023-02-12 ENCOUNTER — Other Ambulatory Visit: Payer: Self-pay | Admitting: Internal Medicine

## 2023-02-12 ENCOUNTER — Other Ambulatory Visit: Payer: Self-pay | Admitting: Family

## 2023-02-14 ENCOUNTER — Other Ambulatory Visit: Payer: Self-pay | Admitting: Internal Medicine

## 2023-02-14 DIAGNOSIS — K219 Gastro-esophageal reflux disease without esophagitis: Secondary | ICD-10-CM

## 2023-02-16 ENCOUNTER — Other Ambulatory Visit: Payer: Self-pay | Admitting: Internal Medicine

## 2023-02-16 DIAGNOSIS — K219 Gastro-esophageal reflux disease without esophagitis: Secondary | ICD-10-CM

## 2023-02-17 ENCOUNTER — Encounter: Payer: Self-pay | Admitting: Internal Medicine

## 2023-02-17 DIAGNOSIS — K219 Gastro-esophageal reflux disease without esophagitis: Secondary | ICD-10-CM

## 2023-02-17 MED ORDER — OMEPRAZOLE 20 MG PO CPDR
DELAYED_RELEASE_CAPSULE | ORAL | 0 refills | Status: DC
Start: 2023-02-17 — End: 2023-03-11

## 2023-02-17 MED ORDER — CITALOPRAM HYDROBROMIDE 40 MG PO TABS: 40.0000 mg | ORAL_TABLET | Freq: Every day | ORAL | 0 refills | Status: DC

## 2023-02-18 ENCOUNTER — Other Ambulatory Visit: Payer: Self-pay | Admitting: Internal Medicine

## 2023-02-18 MED ORDER — PHENTERMINE HCL 37.5 MG PO TABS: 37.5000 mg | ORAL_TABLET | Freq: Every day | ORAL | 0 refills | Status: DC

## 2023-02-18 NOTE — Telephone Encounter (Signed)
Follow up with pt.   Pt states she had it filled. No further action is needed.

## 2023-02-18 NOTE — Telephone Encounter (Signed)
Pt checking on phentermine, asking is she has to wait for appointment or if it can be dispensed now

## 2023-02-19 NOTE — Telephone Encounter (Signed)
Spoke to pt yesterday. She states she had pick up her phentermine Rx that was sent in to [facility]

## 2023-03-03 ENCOUNTER — Ambulatory Visit: Payer: Self-pay | Admitting: Internal Medicine

## 2023-03-10 ENCOUNTER — Ambulatory Visit: Payer: Self-pay | Admitting: Internal Medicine

## 2023-03-11 ENCOUNTER — Other Ambulatory Visit: Payer: Self-pay | Admitting: Internal Medicine

## 2023-03-11 DIAGNOSIS — K219 Gastro-esophageal reflux disease without esophagitis: Secondary | ICD-10-CM

## 2023-03-18 ENCOUNTER — Ambulatory Visit: Payer: Self-pay | Admitting: Internal Medicine

## 2023-03-19 ENCOUNTER — Other Ambulatory Visit: Payer: Self-pay | Admitting: Internal Medicine

## 2023-03-24 ENCOUNTER — Other Ambulatory Visit: Payer: Self-pay | Admitting: Internal Medicine

## 2023-03-24 DIAGNOSIS — K219 Gastro-esophageal reflux disease without esophagitis: Secondary | ICD-10-CM

## 2023-03-31 NOTE — Telephone Encounter (Signed)
A Rx was filled on 03/11/2023 with 90 capsules and 1 refill.   Reach out to pt pharmacy and spoke to [place]. She states pt requested it to be transferred yesterday to [facility] in [place]. No further action is needed.

## 2023-04-15 ENCOUNTER — Ambulatory Visit (INDEPENDENT_AMBULATORY_CARE_PROVIDER_SITE_OTHER): Payer: Self-pay | Admitting: Internal Medicine

## 2023-04-15 ENCOUNTER — Encounter: Payer: Self-pay | Admitting: Internal Medicine

## 2023-04-15 VITALS — BP 118/80 | HR 105 | Temp 98.1°F | Ht 64.2 in | Wt 172.8 lb

## 2023-04-15 DIAGNOSIS — Z79899 Other long term (current) drug therapy: Secondary | ICD-10-CM

## 2023-04-15 DIAGNOSIS — Z0000 Encounter for general adult medical examination without abnormal findings: Secondary | ICD-10-CM

## 2023-04-15 DIAGNOSIS — F411 Generalized anxiety disorder: Secondary | ICD-10-CM

## 2023-04-15 DIAGNOSIS — Z7689 Persons encountering health services in other specified circumstances: Secondary | ICD-10-CM

## 2023-04-15 MED ORDER — PHENTERMINE HCL 37.5 MG PO TABS: 37.5000 mg | ORAL_TABLET | Freq: Every day | ORAL | 2 refills | Status: DC

## 2023-04-15 NOTE — Patient Instructions (Signed)
Good to see you today  Will refill phentermine  up to 90 days  Plan virtual visit in 3 mos before running out of med . Continue lifestyle intervention healthy eating and exercise .   Get gyne pap update as planned

## 2023-04-15 NOTE — Progress Notes (Signed)
No chief complaint on file.   HPI: Patient  Kathleen Hayes  32 y.o. comes in today for Preventive Health Care visit  And med check  Anxiety doing better   not using alprazolam  Citalopram  40  helpful Phentermine  taking helping  has lost  15  #   neds refill no sig se   Health Maintenance  Topic Date Due   COVID-19 Vaccine (1) 05/01/2023 (Originally 08/16/1997)   Cervical Cancer Screening (HPV/Pap Cotest)  07/14/2023 (Originally 08/17/2022)   INFLUENZA VACCINE  09/14/2023 (Originally 01/15/2023)   DTaP/Tdap/Td (3 - Tdap) 12/06/2024   Hepatitis C Screening  Completed   HIV Screening  Completed   HPV VACCINES  Aged Out   Health Maintenance Review LIFESTYLE:  Exercise:  working    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  Tobacco/ETS: ocass Alcohol: 1-2 per week Sugar beverages:not much  Sleep: [facility] [place] [facility].  Drug use: no HH of  2 and dog  2 y  Job change last 2 mos  doing ok  Due for gyne needs new  provider ither here or [place]   ROS:  GEN/ HEENT: No fever, significant weight changes sweats headaches vision problems hearing changes, CV/ PULM; No chest pain shortness of breath cough, syncope,edema  change in exercise tolerance. GI /GU: No adominal pain, vomiting, change in bowel habits. No blood in the stool. No significant GU symptoms. SKIN/HEME: ,no acute skin rashes suspicious lesions or bleeding. No lymphadenopathy, nodules, masses.  NEURO/ PSYCH:  No neurologic signs such as weakness numbness. No depression anxiety. IMM/ Allergy: No unusual infections.  Allergy .   REST of 12 system review negative except as per HPI   Past Medical History:  Diagnosis Date   Concussion    HEAD TRAUMA, CLOSED 06/08/2007   Qualifier: Diagnosis of  By: Fabian Sharp MD, Neta Mends    History of chlamydia 12/28/2012   Shingles     Past Surgical History:  Procedure Laterality Date   ADENOIDECTOMY  age 16   MYRINGOTOMY  30 months   WISDOM TOOTH EXTRACTION      Family History  Problem  Relation Age of Onset   Mitral valve prolapse Father    Hypertension Father    Cancer Maternal Grandfather        PANCREATIC   Cancer Paternal Grandfather        BRAIN TUMOR   Hypertension Paternal Grandfather     Social History   Socioeconomic History   Marital status: Single    Spouse name: Not on file   Number of children: Not on file   Years of education: Not on file   Highest education level: Some college, no degree  Occupational History   Not on file  Tobacco Use   Smoking status: Some Days    Types: Cigarettes   Smokeless tobacco: Never   Tobacco comments:    Pt reports started smoking cigarette 6 months ago. 1 pk last her for a month.   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   Social Determinants of Health   Financial Resource Strain: Medium Risk (04/13/2023)   Overall Financial Resource Strain (CARDIA)    Difficulty of Paying Living Expenses: Somewhat hard  Food Insecurity: Food Insecurity Present (04/13/2023)   Hunger Vital Sign    Worried About Running Out of Food in the Last Year: Sometimes true    Ran Out of Food in the Last Year: Sometimes true  Transportation Needs: No Transportation Needs (04/13/2023)   PRAPARE - Administrator, Civil Service (Medical): No    Lack of  Transportation (Non-Medical): No  Physical Activity: Sufficiently Active (04/13/2023)   Exercise Vital Sign    Days of Exercise per Week: 5 days    Minutes of Exercise per Session: 70 min  Stress: No Stress Concern Present (04/13/2023)   [employer] of Occupational Health - Occupational Stress Questionnaire    Feeling of Stress : Only a little  Social Connections: Moderately Isolated (04/13/2023)   Social Connection and Isolation Panel [NHANES]    Frequency of Communication with Friends and Family: Twice a week    Frequency of Social Gatherings with Friends and Family: Twice a week    Attends Religious Services: Never    Database administrator or Organizations: No    Attends Engineer, structural: Not on file    Marital Status: Living with partner    Outpatient Medications Prior to Visit  Medication Sig Dispense Refill   citalopram (CELEXA) 40 MG tablet Take 1 tablet (40 mg total) by mouth daily. 90 tablet 0   norgestimate-ethinyl estradiol (SPRINTEC 28) 0.25-35 MG-MCG tablet Take 1 tablet by mouth daily. 3 Package 4   omeprazole (PRILOSEC) 20 MG capsule TAKE 1 CAPSULE BY MOUTH ONCE DAILY . APPOINTMENT REQUIRED FOR FUTURE REFILLS 90 capsule 1   phentermine (ADIPEX-P) 37.5 MG tablet TAKE 1 TABLET BY MOUTH ONCE DAILY BEFORE BREAKFAST 30 tablet 0   cetirizine (ZYRTEC) 10 MG tablet Take 10 mg by mouth daily.     phentermine (ADIPEX-P) 37.5 MG tablet Take 1 tablet (37.5 mg total) by mouth daily before breakfast. 30 tablet 0   No facility-administered medications prior to visit.     EXAM:  BP 118/80 (BP Location: Right Arm, Patient Position: Sitting, Cuff Size: Normal)   Pulse (!) 105   Temp 98.1 F (36.7 C) (Oral)   Ht 5' 4.2" (1.631 m)   Wt 172 lb 12.8 oz (78.4 kg)   LMP 04/01/2023 (Approximate)   SpO2 98%   BMI 29.48 kg/m   Body mass index is 29.48 kg/m. Wt Readings from Last 3 Encounters:  04/15/23 172 lb 12.8 oz (78.4 kg)  12/11/22 184 lb (83.5 kg)   01/31/21 145 lb (65.8 kg)    Physical Exam: Vital signs reviewed JYN:WGNF is a well-developed well-nourished alert cooperative    who appearsr stated age in no acute distress.  HEENT: normocephalic atraumatic , Eyes: PERRL EOM's full, conjunctiva clear, Nares: paten,t no deformity discharge or tenderness., Ears: no deformity EAC's clear TMs with normal landmarks.  Mouth: clear OP, no lesions, edema. Tonsil 1+ scarring no active edema  Moist mucous membranes. Dentition in adequate repair. NECK: supple without masses, thyromegaly or bruits. CHEST/PULM:  Clear to auscultation and percussion breath sounds equal no wheeze , rales or rhonchi. No chest wall deformities or tenderness. Breast: normal by inspection . No dimpling, discharge, masses, tenderness or discharge . CV: PMI is nondisplaced, S1 S2 no gallops, murmurs, rubs. Peripheral pulses are full without delay.No JVD .  ABDOMEN: Bowel sounds normal nontender  No guard or rebound, no hepato splenomegal no CVA tenderness.  Extremtities:  No clubbing cyanosis or edema, no acute joint swelling or redness no focal atrophy NEURO:  Oriented x3, cranial nerves 3-12 appear to be intact, no obvious focal weakness,gait within normal limits no abnormal reflexes or asymmetrical SKIN: No acute rashes normal turgor, color, no bruising or petechiae. Multip tatoos  forehead has [place] small cystic ? Mobile notdule nt not noteceable  until patient asked to check  PSYCH: Oriented, good eye contact, no obvious depression anxiety, cognition and judgment appear normal. LN: no cervical axillary inguinal adenopathy  Lab Results  Component Value Date   WBC 6.0 12/12/2022   HGB 13.5 12/12/2022   HCT 41.0 12/12/2022   PLT 294.0 12/12/2022   GLUCOSE 91 12/12/2022   CHOL 151 12/12/2022   TRIG 280.0 (H) 12/12/2022   HDL 52.80 12/12/2022   LDLDIRECT 81.0 12/12/2022   LDLCALC 47 01/12/2014   ALT 15 12/12/2022   AST 16 12/12/2022   NA 137 12/12/2022   K 4.1 12/12/2022    CL 103 12/12/2022   CREATININE 0.52 12/12/2022   BUN 10 12/12/2022   CO2 27 12/12/2022   TSH 1.23 12/12/2022   HGBA1C 5.4 12/12/2022    BP Readings from Last 3 Encounters:  04/15/23 118/80  05/04/18 (!) 98/50  03/03/18 102/62    Lab results reviewed with patient  tg up  lsi advised   ASSESSMENT AND PLAN:  Discussed the following assessment and plan:    ICD-10-CM   1. Visit for preventive health examination  Z00.00     2. Medication management  Z79.899     3. Anxiety state  F41.1    improve controlled on citalopram    4. Encounter for weight management  Z76.89    benefit more than risk  has lost cloes to 15#  continue phentermine  med chek in 3 mos  virtual ok    Get gyne check  Continue meds as planned  3 mos virtual ok for med check  Follow nodule on forehead if enlarging or  skin changes   seem benign process. Return in about 3 months (around 07/16/2023) for virtual med check  or as  needed .  Patient Care Team: Brithney Bensen, Neta Mends, MD as PCP - Regenia Skeeter, MD (Dermatology) Ok Edwards, MD (Inactive) (Obstetrics and Gynecology) Barbaraann Boys, MD (Psychiatry) Patient Instructions  Good to see you today  Will refill phentermine  up to 90 days  Plan virtual visit in 3 mos before running out of med . Continue lifestyle intervention healthy eating and exercise .   Get gyne pap update as planned   Neta Mends. Deniz Hannan M.D.

## 2023-04-23 ENCOUNTER — Telehealth: Payer: Self-pay | Admitting: Physician Assistant

## 2023-04-23 DIAGNOSIS — J069 Acute upper respiratory infection, unspecified: Secondary | ICD-10-CM

## 2023-04-23 MED ORDER — BENZONATATE 100 MG PO CAPS
100.0000 mg | ORAL_CAPSULE | Freq: Three times a day (TID) | ORAL | 0 refills | Status: DC | PRN
Start: 2023-04-23 — End: 2023-06-09

## 2023-04-23 NOTE — Progress Notes (Signed)
I have spent 5 minutes in review of e-visit questionnaire, review and updating patient chart, medical decision making and response to patient.   Mia Milan Cody Jacklynn Dehaas, PA-C    

## 2023-04-23 NOTE — Progress Notes (Signed)

## 2023-05-19 ENCOUNTER — Telehealth: Payer: Self-pay | Admitting: Internal Medicine

## 2023-05-19 NOTE — Telephone Encounter (Signed)
Prescription Request  05/19/2023  LOV: 04/15/2023  What is the name of the medication or equipment?  citalopram (CELEXA) 40 MG tablet  Have you contacted your pharmacy to request a refill? Yes   Which pharmacy would you like this sent to?   [facility] [address] [place], [place] - [address] [place] [address] Phone: [phone] Fax: [phone]    Patient notified that their request is being sent to the clinical staff for review and that they should receive a response within 2 business days.   Please advise at Mobile [phone] (mobile)

## 2023-05-20 ENCOUNTER — Other Ambulatory Visit: Payer: Self-pay | Admitting: Internal Medicine

## 2023-05-20 MED ORDER — CITALOPRAM HYDROBROMIDE 40 MG PO TABS: 40.0000 mg | ORAL_TABLET | Freq: Every day | ORAL | 0 refills | Status: DC

## 2023-05-20 NOTE — Telephone Encounter (Signed)
Contacted pt and inform her that Rx is sent. Also reminds pt to schedule her 3 months med check in Jan. Offer to schedule an appt. Pt declined and states she will schedule an appt in January.

## 2023-05-20 NOTE — Addendum Note (Signed)
Addended by: Vickii Chafe on: 05/20/2023 08:47 AM   Modules accepted: Orders

## 2023-05-24 ENCOUNTER — Telehealth: Payer: Medicaid Other | Admitting: Family Medicine

## 2023-05-24 DIAGNOSIS — K121 Other forms of stomatitis: Secondary | ICD-10-CM | POA: Diagnosis not present

## 2023-05-24 MED ORDER — CHLORHEXIDINE GLUCONATE 0.12 % MT SOLN: 15.0000 mL | Freq: Two times a day (BID) | OROMUCOSAL | 0 refills | Status: AC

## 2023-05-24 NOTE — Progress Notes (Signed)
E-Visit for Mouth Ulcers  We are sorry that you are not feeling well.  Here is how we plan to help!  Based on what you have shared with me, it appears that you do have mouth ulcer(s).     The following medications should decrease the discomfort and help with healing. Chlorhexidine mouthwash daily for 3 days   Mouth ulcers are painful areas in the mouth and gums. These are also known as "canker sores".  They can occur anywhere inside the mouth. While mostly harmless, mouth ulcers can be extremely uncomfortable and may make it difficult to eat, drink, and brush your teeth.  You may have more than 1 ulcer and they can vary and change in size. Mouth ulcers are not contagious and should not be confused with cold sores.  Cold sores appear on the lip or around the outside of the mouth and often begin with a tingling, burning or itching sensation.   While the exact causes are unknown, some common causes and factors that may aggravate mouth ulcers include: Genetics - Sometimes mouth ulcers run in families High alcohol intake Acidic foods such as citrus fruits like pineapple, grapefruit, orange fruits/juices, may aggravate mouth ulcers Other foods high in acidity or spice such as coffee, chocolate, chips, pretzels, eggs, nuts, cheese Quitting smoking Injury caused by biting the tongue or inside of the cheek Diet lacking in B-12, zinc, folic acid or iron Female hormone shifts with menstruation Excessive fatigue, emotional stress or anxiety Prevention: Talk to your doctor if you are taking meds that are known to cause mouth ulcers such as:   Anti-inflammatory drugs (for example Ibuprofen, Naproxen sodium), pain killers, Beta blockers, Oral nicotine replacement drugs, Some street drugs (heroin).   Avoid allowing any tablets to dissolve in your mouth that are meant to swallowed whole Avoid foods/drinks that trigger or worsen symptoms Keep your mouth clean with daily brushing and flossing  Home  Care: The goal with treatment is to ease the pain where ulcers occur and help them heal as quickly as possible.  There is no medical treatment to prevent mouth ulcers from coming back or recurring.  Avoid spicy and acidic foods Eat soft foods and avoid rough, crunchy foods Avoid chewing gum Do not use toothpaste that contains sodium lauryl sulphite Use a straw to drink which helps avoid liquids toughing the ulcers near the front of your mouth Use a very soft toothbrush If you have dentures or dental hardware that you feel is not fitting well or contributing to his, please see your dentist. Use saltwater mouthwash which helps healing. Dissolve a  teaspoon of salt in a glass of warm water. Swish around your mouth and spit it out. This can be used as needed if it is soothing.   GET HELP RIGHT AWAY IF: Persistent ulcers require checking IN PERSON (face to face). Any mouth lesion lasting longer than a month should be seen by your DENTIST as soon as possible for evaluation for possible oral cancer. If you have a non-painful ulcer in 1 or more areas of your mouth Ulcers that are spreading, are very large or particularly painful Ulcers last longer than one week without improving on treatment If you develop a fever, swollen glands and begin to feel unwell Ulcers that developed after starting a new medication MAKE SURE YOU: Understand these instructions. Will watch your condition. Will get help right away if you are not doing well or get worse.  Thank you for choosing an e-visit.  Your e-visit answers were reviewed by a board certified advanced clinical practitioner to complete your personal care plan. Depending upon the condition, your plan could have included both over the counter or prescription medications.  Please review your pharmacy choice. Make sure the pharmacy is open so you can pick up prescription now. If there is a problem, you may contact your provider through [employer] and  have the prescription routed to another pharmacy.  Your safety is important to [place]. If you have drug allergies check your prescription carefully.   For the next 24 hours you can use MyChart to ask questions about today's visit, request a non-urgent call back, or ask for a work or school excuse. You will get an email in the next two days asking about your experience. I hope that your e-visit has been valuable and will speed your recovery.    have provided 5 minutes of non face to face time during this encounter for chart review and documentation.

## 2023-06-09 ENCOUNTER — Telehealth: Payer: Medicaid Other | Admitting: Internal Medicine

## 2023-06-09 ENCOUNTER — Encounter: Payer: Self-pay | Admitting: Internal Medicine

## 2023-06-09 VITALS — Ht 64.2 in | Wt 168.0 lb

## 2023-06-09 DIAGNOSIS — Z7689 Persons encountering health services in other specified circumstances: Secondary | ICD-10-CM | POA: Diagnosis not present

## 2023-06-09 DIAGNOSIS — K219 Gastro-esophageal reflux disease without esophagitis: Secondary | ICD-10-CM

## 2023-06-09 DIAGNOSIS — Z79899 Other long term (current) drug therapy: Secondary | ICD-10-CM

## 2023-06-09 MED ORDER — PHENTERMINE HCL 37.5 MG PO TABS: 37.5000 mg | ORAL_TABLET | Freq: Every day | ORAL | 2 refills | Status: DC

## 2023-06-09 NOTE — Progress Notes (Signed)
Virtual Visit via Video Note  I connected with Kathleen Hayes on 06/09/23 at  8:45 AM EST by a video enabled telemedicine application and verified that I am speaking with the correct person using two identifiers. Location patient: home Location provider:work  office Persons participating in the virtual visit: patient, provider  Patient aware  of the limitations of evaluation and management by telemedicine and  availability of in person appointments. and agreed to proceed.   HPI: Kathleen Hayes presents for video visit  for phentermine and weight control  She is attending to diet and activity as possible. Getting some success taking every day and denies   isg se mood sleep gi  cv.  Would like to remain on same med. No Other major change in health  Citalopram helpful. Omeprazole every day to suppress gerd sx   gets sx when misses? ROS: See pertinent positives and negatives per HPI.  Past Medical History:  Diagnosis Date   Concussion    HEAD TRAUMA, CLOSED 06/08/2007   Qualifier: Diagnosis of  By: Fabian Sharp MD, Neta Mends    History of chlamydia 12/28/2012   Shingles     Past Surgical History:  Procedure Laterality Date   ADENOIDECTOMY  age 71   MYRINGOTOMY  16 months   WISDOM TOOTH EXTRACTION      Family History  Problem Relation Age of Onset   Mitral valve prolapse Father    Hypertension Father    Cancer Maternal Grandfather        PANCREATIC   Cancer Paternal Grandfather        BRAIN TUMOR   Hypertension Paternal Grandfather     Social History   Tobacco Use   Smoking status: Some Days    Types: Cigarettes   Smokeless tobacco: Never   Tobacco comments:    Pt reports started smoking cigarette 6 months ago. 1 pk last her for a month.   Vaping Use   Vaping status: Never Used  Substance Use Topics   Alcohol use: Yes    Alcohol/week: 0.0 standard drinks of alcohol    Comment: Rare   Drug use: No      Current Outpatient Medications:    citalopram (CELEXA) 40  MG tablet, Take 1 tablet (40 mg total) by mouth daily., Disp: 90 tablet, Rfl: 0   norgestimate-ethinyl estradiol (SPRINTEC 28) 0.25-35 MG-MCG tablet, Take 1 tablet by mouth daily., Disp: 3 Package, Rfl: 4   omeprazole (PRILOSEC) 20 MG capsule, TAKE 1 CAPSULE BY MOUTH ONCE DAILY . APPOINTMENT REQUIRED FOR FUTURE REFILLS, Disp: 90 capsule, Rfl: 1   phentermine (ADIPEX-P) 37.5 MG tablet, Take 1 tablet (37.5 mg total) by mouth daily before breakfast., Disp: 30 tablet, Rfl: 2  EXAM: BP Readings from Last 3 Encounters:  04/15/23 118/80  05/04/18 (!) 98/50  03/03/18 102/62   Wt Readings from Last 3 Encounters:  06/09/23 168 lb (76.2 kg)  04/15/23 172 lb 12.8 oz (78.4 kg)  12/11/22 184 lb (83.5 kg)     VITALS per patient if applicable:  GENERAL: alert, oriented, appears well and in no acute distress  HEENT: atraumatic, conjunttiva clear, no obvious abnormalities on inspection of external nose and ears  NECK: normal movements of the head and neck  LUNGS: on inspection no signs of respiratory distress, breathing rate appears normal, no obvious gross SOB, gasping or wheezing  CV: no obvious cyanosis  MS: moves all visible extremities without noticeable abnormality  PSYCH/NEURO: pleasant and cooperative, no obvious depression or anxiety, speech and  thought processing grossly intact Lab Results  Component Value Date   WBC 6.0 12/12/2022   HGB 13.5 12/12/2022   HCT 41.0 12/12/2022   PLT 294.0 12/12/2022   GLUCOSE 91 12/12/2022   CHOL 151 12/12/2022   TRIG 280.0 (H) 12/12/2022   HDL 52.80 12/12/2022   LDLDIRECT 81.0 12/12/2022   LDLCALC 47 01/12/2014   ALT 15 12/12/2022   AST 16 12/12/2022   NA 137 12/12/2022   K 4.1 12/12/2022   CL 103 12/12/2022   CREATININE 0.52 12/12/2022   BUN 10 12/12/2022   CO2 27 12/12/2022   TSH 1.23 12/12/2022   HGBA1C 5.4 12/12/2022    ASSESSMENT AND PLAN:  Discussed the following assessment and plan:    ICD-10-CM   1. Encounter for weight  management  Z76.89     2. Medication management  Z79.899     3. Gastroesophageal reflux disease without esophagitis  K21.9     Positive effect and patient lsi  Continue medication and fu  3 months. At this time saty on omeprazole in future may try to wean to as needed or H2 antagonist .   Counseled.   Expectant management and discussion of plan and treatment with opportunity to ask questions and all were answered. The patient agreed with the plan and demonstrated an understanding of the instructions.   Advised to call back or seek an in-person evaluation if worsening  or having  further concerns  in interim. Return in about 3 months (around 09/07/2023) for medication.    Berniece Andreas, MD

## 2023-07-18 ENCOUNTER — Telehealth: Payer: Medicaid Other | Admitting: Family Medicine

## 2023-07-18 DIAGNOSIS — J111 Influenza due to unidentified influenza virus with other respiratory manifestations: Secondary | ICD-10-CM

## 2023-07-18 MED ORDER — OSELTAMIVIR PHOSPHATE 75 MG PO CAPS: 75.0000 mg | ORAL_CAPSULE | Freq: Two times a day (BID) | ORAL | 0 refills | Status: AC

## 2023-07-18 MED ORDER — OSELTAMIVIR PHOSPHATE 75 MG PO CAPS: 75.0000 mg | ORAL_CAPSULE | Freq: Two times a day (BID) | ORAL | 0 refills | Status: DC

## 2023-07-18 NOTE — Progress Notes (Signed)

## 2023-07-18 NOTE — Addendum Note (Signed)
Addended by: Georgana Curio on: 07/18/2023 10:42 AM   Modules accepted: Orders

## 2023-08-12 ENCOUNTER — Other Ambulatory Visit: Payer: Self-pay | Admitting: Internal Medicine

## 2023-09-08 ENCOUNTER — Encounter: Payer: Self-pay | Admitting: Internal Medicine

## 2023-09-08 DIAGNOSIS — K219 Gastro-esophageal reflux disease without esophagitis: Secondary | ICD-10-CM

## 2023-09-08 MED ORDER — OMEPRAZOLE 20 MG PO CPDR: DELAYED_RELEASE_CAPSULE | ORAL | 0 refills | Status: DC

## 2023-09-14 ENCOUNTER — Other Ambulatory Visit: Payer: Self-pay | Admitting: Internal Medicine

## 2023-09-15 NOTE — Progress Notes (Deleted)
 Virtual Visit via Video Note  I connected with Kathleen Hayes on 09/15/23 at 11:00 AM EDT by a video enabled telemedicine application and verified that I am speaking with the correct person using two identifiers. Location patient: home Location provider:work office Persons participating in the virtual visit: patient, provider   Patient aware  of the limitations of evaluation and management by telemedicine and  availability of in person appointments. and agreed to proceed.   HPI: Kathleen Hayes presents for video visit weight management   Placed on phentermine 12 24     ROS: See pertinent positives and negatives per HPI.  Past Medical History:  Diagnosis Date   Concussion    HEAD TRAUMA, CLOSED 06/08/2007   Qualifier: Diagnosis of  By: Fabian Sharp MD, Neta Mends    History of chlamydia 12/28/2012   Shingles     Past Surgical History:  Procedure Laterality Date   ADENOIDECTOMY  age 33   MYRINGOTOMY  79 months   WISDOM TOOTH EXTRACTION      Family History  Problem Relation Age of Onset   Mitral valve prolapse Father    Hypertension Father    Cancer Maternal Grandfather        PANCREATIC   Cancer Paternal Grandfather        BRAIN TUMOR   Hypertension Paternal Grandfather     Social History   Tobacco Use   Smoking status: Some Days    Types: Cigarettes   Smokeless tobacco: Never   Tobacco comments:    Pt reports started smoking cigarette 6 months ago. 1 pk last her for a month.   Vaping Use   Vaping status: Never Used  Substance Use Topics   Alcohol use: Yes    Alcohol/week: 0.0 standard drinks of alcohol    Comment: Rare   Drug use: No      Current Outpatient Medications:    citalopram (CELEXA) 40 MG tablet, TAKE 1 TABLET BY MOUTH EVERY DAY, Disp: 90 tablet, Rfl: 0   norgestimate-ethinyl estradiol (SPRINTEC 28) 0.25-35 MG-MCG tablet, Take 1 tablet by mouth daily., Disp: 3 Package, Rfl: 4   omeprazole (PRILOSEC) 20 MG capsule, TAKE 1 CAPSULE BY MOUTH ONCE  DAILY ., Disp: 90 capsule, Rfl: 0   phentermine (ADIPEX-P) 37.5 MG tablet, Take 1 tablet (37.5 mg total) by mouth daily before breakfast., Disp: 30 tablet, Rfl: 2  EXAM: BP Readings from Last 3 Encounters:  04/15/23 118/80  05/04/18 (!) 98/50  03/03/18 102/62    VITALS per patient if applicable:  GENERAL: alert, oriented, appears well and in no acute distress  HEENT: atraumatic, conjunttiva clear, no obvious abnormalities on inspection of external nose and ears  NECK: normal movements of the head and neck  LUNGS: on inspection no signs of respiratory distress, breathing rate appears normal, no obvious gross SOB, gasping or wheezing  CV: no obvious cyanosis  MS: moves all visible extremities without noticeable abnormality  PSYCH/NEURO: pleasant and cooperative, no obvious depression or anxiety, speech and thought processing grossly intact Lab Results  Component Value Date   WBC 6.0 12/12/2022   HGB 13.5 12/12/2022   HCT 41.0 12/12/2022   PLT 294.0 12/12/2022   GLUCOSE 91 12/12/2022   CHOL 151 12/12/2022   TRIG 280.0 (H) 12/12/2022   HDL 52.80 12/12/2022   LDLDIRECT 81.0 12/12/2022   LDLCALC 47 01/12/2014   ALT 15 12/12/2022   AST 16 12/12/2022   NA 137 12/12/2022   K 4.1 12/12/2022   CL 103 12/12/2022  CREATININE 0.52 12/12/2022   BUN 10 12/12/2022   CO2 27 12/12/2022   TSH 1.23 12/12/2022   HGBA1C 5.4 12/12/2022    ASSESSMENT AND PLAN:  Discussed the following assessment and plan:  No diagnosis found.  Counseled.   Expectant management and discussion of plan and treatment with opportunity to ask questions and all were answered. The patient agreed with the plan and demonstrated an understanding of the instructions.   Advised to call back or seek an in-person evaluation if worsening  or having  further concerns  in interim. No follow-ups on file.    Berniece Andreas, MD

## 2023-09-16 ENCOUNTER — Encounter: Payer: Self-pay | Admitting: Internal Medicine

## 2023-09-16 ENCOUNTER — Telehealth: Admitting: Internal Medicine

## 2023-09-17 MED ORDER — PHENTERMINE HCL 37.5 MG PO TABS: 37.5000 mg | ORAL_TABLET | Freq: Every day | ORAL | 0 refills | Status: DC

## 2023-09-17 NOTE — Telephone Encounter (Signed)
!   Month refill sent in

## 2023-09-23 ENCOUNTER — Telehealth: Admitting: Internal Medicine

## 2023-09-23 ENCOUNTER — Telehealth: Payer: Self-pay

## 2023-09-23 NOTE — Telephone Encounter (Signed)
 Attempt to reach pt to r/s for today appt.   Attempt to reach to pt several times today. Left  voicemail and sent video link.   Left a detail message for pt to call us back to schedule her appt.

## 2023-09-28 ENCOUNTER — Encounter: Payer: Self-pay | Admitting: Internal Medicine

## 2023-10-22 ENCOUNTER — Other Ambulatory Visit: Payer: Self-pay | Admitting: Internal Medicine

## 2023-10-23 ENCOUNTER — Other Ambulatory Visit: Payer: Self-pay | Admitting: Internal Medicine

## 2023-10-27 ENCOUNTER — Encounter: Payer: Self-pay | Admitting: Internal Medicine

## 2023-10-27 ENCOUNTER — Telehealth (INDEPENDENT_AMBULATORY_CARE_PROVIDER_SITE_OTHER): Admitting: Internal Medicine

## 2023-10-27 VITALS — Ht 64.2 in | Wt 152.0 lb

## 2023-10-27 DIAGNOSIS — Z7689 Persons encountering health services in other specified circumstances: Secondary | ICD-10-CM | POA: Diagnosis not present

## 2023-10-27 DIAGNOSIS — Z79899 Other long term (current) drug therapy: Secondary | ICD-10-CM | POA: Diagnosis not present

## 2023-10-27 DIAGNOSIS — F909 Attention-deficit hyperactivity disorder, unspecified type: Secondary | ICD-10-CM

## 2023-10-27 MED ORDER — LISDEXAMFETAMINE DIMESYLATE 20 MG PO CAPS: 20.0000 mg | ORAL_CAPSULE | Freq: Every day | ORAL | 0 refills | Status: DC

## 2023-10-27 NOTE — Progress Notes (Signed)
 Virtual Visit via Video Note  I connected with Kathleen Hayes on 10/27/23 at  8:30 AM EDT by a video enabled telemedicine application and verified that I am speaking with the correct person using two identifiers. Location patient: home Location provider:work office Persons participating in the virtual visit: patient, provider   Patient aware  of the limitations of evaluation and management by telemedicine and  availability of in person appointments. and agreed to proceed.   HPI: Kathleen Hayes presents for video visit fu meds  Weight management doing ok better since last   not sure med is helping that much but goes to gyme trainer  and has been better at muscle mass and weight maintenance  Just moved to house friend .from parents house   Tobacco  n Etoh  sometimes 3 per week max  Rd N Sleep about 6- 7  Collagen peptides.  Has hx of adhd add  and on adderall in past 01-1089 yesr ago  had some wear foo sx    Noting with current schedule  often hard to focus and rapid switch. Would like to try getting back on meds   help for  functioning esp work tasks  Mood  and physical status is stable at this time .  ROS: See pertinent positives and negatives per HPI. No cv pulm sx reported   Past Medical History:  Diagnosis Date   Concussion    HEAD TRAUMA, CLOSED 06/08/2007   Qualifier: Diagnosis of  By: Ethel Henry MD, Joaquim Muir    History of chlamydia 12/28/2012   Shingles     Past Surgical History:  Procedure Laterality Date   ADENOIDECTOMY  age 43   MYRINGOTOMY  38 months   WISDOM TOOTH EXTRACTION      Family History  Problem Relation Age of Onset   Mitral valve prolapse Father    Hypertension Father    Cancer Maternal Grandfather        PANCREATIC   Cancer Paternal Grandfather        BRAIN TUMOR   Hypertension Paternal Grandfather     Social History   Tobacco Use   Smoking status: Some Days    Types: Cigarettes   Smokeless tobacco: Never   Tobacco comments:    Pt  reports started smoking cigarette 6 months ago. 1 pk last her for a month.   Vaping Use   Vaping status: Never Used  Substance Use Topics   Alcohol use: Yes    Alcohol/week: 0.0 standard drinks of alcohol    Comment: Rare   Drug use: No      Current Outpatient Medications:    citalopram  (CELEXA ) 40 MG tablet, TAKE 1 TABLET BY MOUTH EVERY DAY, Disp: 90 tablet, Rfl: 0   lisdexamfetamine (VYVANSE) 20 MG capsule, Take 1 capsule (20 mg total) by mouth daily., Disp: 30 capsule, Rfl: 0   norgestimate -ethinyl estradiol  (SPRINTEC 28) 0.25-35 MG-MCG tablet, Take 1 tablet by mouth daily., Disp: 3 Package, Rfl: 4   omeprazole  (PRILOSEC) 20 MG capsule, TAKE 1 CAPSULE BY MOUTH ONCE DAILY ., Disp: 90 capsule, Rfl: 0  EXAM: BP Readings from Last 3 Encounters:  04/15/23 118/80  05/04/18 (!) 98/50  03/03/18 102/62  152 Wt Readings from Last 3 Encounters:  10/27/23 152 lb (68.9 kg)  06/09/23 168 lb (76.2 kg)  04/15/23 172 lb 12.8 oz (78.4 kg)     VITALS per patient if applicable:  GENERAL: alert, oriented, appears well and in no acute distress some inc motor movement  nl speech and affect  thought process   HEENT: atraumatic, conjunttiva clear, no obvious abnormalities on inspection of external nose and ears  NECK: normal movements of the head and neck  LUNGS: on inspection no signs of respiratory distress, breathing rate appears normal, no obvious gross SOB, gasping or wheezing  CV: no obvious cyanosis  MS: moves all visible extremities without noticeable abnormality  PSYCH/NEURO: pleasant and cooperative, no obvious depression or anxiety, speech and thought processing grossly intact Lab Results  Component Value Date   WBC 6.0 12/12/2022   HGB 13.5 12/12/2022   HCT 41.0 12/12/2022   PLT 294.0 12/12/2022   GLUCOSE 91 12/12/2022   CHOL 151 12/12/2022   TRIG 280.0 (H) 12/12/2022   HDL 52.80 12/12/2022   LDLDIRECT 81.0 12/12/2022   LDLCALC 47 01/12/2014   ALT 15 12/12/2022   AST  16 12/12/2022   NA 137 12/12/2022   K 4.1 12/12/2022   CL 103 12/12/2022   CREATININE 0.52 12/12/2022   BUN 10 12/12/2022   CO2 27 12/12/2022   TSH 1.23 12/12/2022   HGBA1C 5.4 12/12/2022    ASSESSMENT AND PLAN:  Discussed the following assessment and plan:    ICD-10-CM   1. Medication management  Z79.899 lisdexamfetamine (VYVANSE) 20 MG capsule    2. Encounter for weight management  Z76.89     3. Attention deficit hyperactivity disorder (ADHD), unspecified ADHD type  F90.9 lisdexamfetamine (VYVANSE) 20 MG capsule     Weight management is doing better with  lifestype  training  bmp in 25 range  Mood stable  . Doing well  Plan optimize sleep   Stop the phentermine   Had  some  wear off side effects of adderall in past   agree that vyvanse would be a better choice .  Counseled.  Begin trial vyvyanse 20 mg  and will increase as indicated . Plan  fu visit virtual  ok in 1-2 months  ( I will be out of office early June but make fu after that . And  covering team may be able to refill    Expectant management and discussion of plan and treatment with opportunity to ask questions and all were answered. The patient agreed with the plan and demonstrated an understanding of the instructions.   Advised to call back or seek an in-person evaluation if worsening  or having  further concerns  in interim. Return in about 1 month (around 11/27/2023) for medication check .    Daphine Eagle, MD

## 2023-11-03 ENCOUNTER — Encounter: Payer: Self-pay | Admitting: Internal Medicine

## 2023-11-04 MED ORDER — CITALOPRAM HYDROBROMIDE 40 MG PO TABS: 40.0000 mg | ORAL_TABLET | Freq: Every day | ORAL | 0 refills | Status: DC

## 2023-11-24 ENCOUNTER — Encounter: Payer: Self-pay | Admitting: Internal Medicine

## 2023-11-25 ENCOUNTER — Other Ambulatory Visit: Payer: Self-pay | Admitting: Family

## 2023-11-25 DIAGNOSIS — Z79899 Other long term (current) drug therapy: Secondary | ICD-10-CM

## 2023-11-25 DIAGNOSIS — F909 Attention-deficit hyperactivity disorder, unspecified type: Secondary | ICD-10-CM

## 2023-11-25 MED ORDER — LISDEXAMFETAMINE DIMESYLATE 20 MG PO CAPS: 20.0000 mg | ORAL_CAPSULE | Freq: Every day | ORAL | 0 refills | Status: DC

## 2023-12-03 ENCOUNTER — Encounter: Payer: Self-pay | Admitting: Internal Medicine

## 2023-12-04 MED ORDER — OMEPRAZOLE 20 MG PO CPDR: 20.0000 mg | DELAYED_RELEASE_CAPSULE | Freq: Every day | ORAL | 3 refills | Status: DC

## 2023-12-11 ENCOUNTER — Telehealth: Admitting: Physician Assistant

## 2023-12-11 DIAGNOSIS — R112 Nausea with vomiting, unspecified: Secondary | ICD-10-CM

## 2023-12-11 MED ORDER — ONDANSETRON HCL 4 MG PO TABS: 4.0000 mg | ORAL_TABLET | Freq: Three times a day (TID) | ORAL | 0 refills | Status: DC | PRN

## 2023-12-11 NOTE — Progress Notes (Signed)
 E-Visit for Nausea and Vomiting   We are sorry that you are not feeling well. Here is how we plan to help!  Based on what you have shared with me it looks like you have a Virus that is irritating your GI tract.  Vomiting is the forceful emptying of a portion of the stomach's content through the mouth.  Although nausea and vomiting can make you feel miserable, it's important to remember that these are not diseases, but rather symptoms of an underlying illness.  When we treat short term symptoms, we always caution that any symptoms that persist should be fully evaluated in a medical office.  I have prescribed a medication that will help alleviate your symptoms and allow you to stay hydrated:  Zofran 4 mg 1 tablet every 8 hours as needed for nausea and vomiting  HOME CARE: Drink clear liquids.  This is very important! Dehydration (the lack of fluid) can lead to a serious complication.  Start off with 1 tablespoon every 5 minutes for 8 hours. You may begin eating bland foods after 8 hours without vomiting.  Start with saltine crackers, white bread, rice, mashed potatoes, applesauce. After 48 hours on a bland diet, you may resume a normal diet. Try to go to sleep.  Sleep often empties the stomach and relieves the need to vomit.  GET HELP RIGHT AWAY IF:  Your symptoms do not improve or worsen within 2 days after treatment. You have a fever for over 3 days. You cannot keep down fluids after trying the medication.  MAKE SURE YOU:  Understand these instructions. Will watch your condition. Will get help right away if you are not doing well or get worse.    Thank you for choosing an e-visit.  Your e-visit answers were reviewed by a board certified advanced clinical practitioner to complete your personal care plan. Depending upon the condition, your plan could have included both over the counter or prescription medications.  Please review your pharmacy choice. Make sure the pharmacy is open so  you can pick up prescription now. If there is a problem, you may contact your provider through [employer] and have the prescription routed to another pharmacy.  Your safety is important to [place]. If you have drug allergies check your prescription carefully.   For the next 24 hours you can use MyChart to ask questions about today's visit, request a non-urgent call back, or ask for a work or school excuse. You will get an email in the next two days asking about your experience. I hope that your e-visit has been valuable and will speed your recovery.    have provided 5 minutes of non face to face time during this encounter for chart review and documentation.

## 2023-12-21 ENCOUNTER — Emergency Department (HOSPITAL_BASED_OUTPATIENT_CLINIC_OR_DEPARTMENT_OTHER): Admitting: Radiology

## 2023-12-21 ENCOUNTER — Other Ambulatory Visit: Payer: Self-pay

## 2023-12-21 ENCOUNTER — Encounter (HOSPITAL_BASED_OUTPATIENT_CLINIC_OR_DEPARTMENT_OTHER): Payer: Self-pay | Admitting: Emergency Medicine

## 2023-12-21 ENCOUNTER — Emergency Department (HOSPITAL_BASED_OUTPATIENT_CLINIC_OR_DEPARTMENT_OTHER)
Admission: EM | Admit: 2023-12-21 | Discharge: 2023-12-21 | Disposition: A | Discharge status: Home / Self Care | Attending: Emergency Medicine | Admitting: Emergency Medicine

## 2023-12-21 DIAGNOSIS — S20211A Contusion of right front wall of thorax, initial encounter: Secondary | ICD-10-CM | POA: Diagnosis not present

## 2023-12-21 DIAGNOSIS — W01198A Fall on same level from slipping, tripping and stumbling with subsequent striking against other object, initial encounter: Secondary | ICD-10-CM | POA: Diagnosis not present

## 2023-12-21 DIAGNOSIS — S29001A Unspecified injury of muscle and tendon of front wall of thorax, initial encounter: Secondary | ICD-10-CM | POA: Diagnosis present

## 2023-12-21 MED ORDER — HYDROCODONE-ACETAMINOPHEN 5-325 MG PO TABS: 1.0000 | ORAL_TABLET | Freq: Four times a day (QID) | ORAL | 0 refills | Status: DC | PRN

## 2023-12-21 NOTE — ED Notes (Signed)
 Patient transported to X-ray

## 2023-12-21 NOTE — Discharge Instructions (Addendum)
 Your chest xray does not show any abnormally.  You could have a small crack in the ribs that does not show up on xray.  Make sure to take deep breaths.

## 2023-12-21 NOTE — ED Triage Notes (Signed)
 C/o R sided rib pain after having someone fall on her side over the weekend, Denies SHOB.

## 2023-12-21 NOTE — ED Notes (Signed)
 Pt given discharge instructions and reviewed prescriptions. Opportunities given for questions. Pt verbalizes understanding. Jillyn Hidden, RN

## 2023-12-21 NOTE — ED Provider Notes (Signed)
 [place] EMERGENCY DEPARTMENT AT [facility] Provider Note   CSN: [id number] Arrival date & time: 12/21/23  9178     Patient presents with: Rib Injury   Kathleen Hayes is a 33 y.o. female.   Patient reports that a friend was trying to help prevent her from falling and fell on top of her hitting her right ribs.  Patient reports injury happened on Saturday.  Patient reports pain with movement.  Patient denies any shortness of breath.  She has not had any cough or congestion.  Patient states she is able to take a deep breath.  She does have pain with inspiration.  Patient denies any fever or chills she denies any other area of injury.        Prior to Admission medications   Medication Sig Start Date End Date Taking? Authorizing Provider  HYDROcodone -acetaminophen  (NORCO/VICODIN) 5-325 MG tablet Take 1 tablet by mouth every 6 (six) hours as needed for moderate pain (pain score 4-6). 12/21/23  Yes Flint Sonny POUR, PA-C  citalopram  (CELEXA ) 40 MG tablet Take 1 tablet (40 mg total) by mouth daily. 11/04/23   Panosh, Wanda K, MD  lisdexamfetamine (VYVANSE ) 20 MG capsule Take 1 capsule (20 mg total) by mouth daily. 11/25/23   Webb, Padonda B, FNP  norgestimate -ethinyl estradiol  (SPRINTEC 28) 0.25-35 MG-MCG tablet Take 1 tablet by mouth daily. 02/02/18   Neysa Inocente PARAS, NP  omeprazole  (PRILOSEC) 20 MG capsule TAKE 1 CAPSULE BY MOUTH ONCE DAILY . 09/08/23   Panosh, Apolinar POUR, MD  omeprazole  (PRILOSEC) 20 MG capsule Take 1 capsule (20 mg total) by mouth daily. 12/04/23   Panosh, Apolinar POUR, MD  ondansetron  (ZOFRAN ) 4 MG tablet Take 1 tablet (4 mg total) by mouth every 8 (eight) hours as needed for nausea or vomiting. 12/11/23   Blair, Diane W, FNP    Allergies: Sulfonamide derivatives    Review of Systems  All other systems reviewed and are negative.   Updated Vital Signs BP (!) 122/57   Pulse 74   Temp 98.1 F (36.7 C) (Oral)   Resp 18   SpO2 99%   Physical Exam Vitals and nursing  note reviewed.  Constitutional:      Appearance: She is well-developed.  HENT:     Head: Normocephalic.  Cardiovascular:     Rate and Rhythm: Normal rate.  Pulmonary:     Effort: Pulmonary effort is normal.     Comments: Tender right ribs, pain with deep breath.  No bruising, Chest:     Chest wall: Tenderness present.  Abdominal:     General: There is no distension.  Musculoskeletal:        General: Normal range of motion.  Skin:    General: Skin is warm.  Neurological:     General: No focal deficit present.     Mental Status: She is alert and oriented to person, place, and time.  Psychiatric:        Mood and Affect: Mood normal.     (all labs ordered are listed, but only abnormal results are displayed) Labs Reviewed - No data to display  EKG: None  Radiology: DG Chest 2 View Result Date: 12/21/2023 CLINICAL DATA:  Patient fell in her side over the weekend. Shortness of breath and rib pain. EXAM: CHEST - 2 VIEW COMPARISON:  05/23/2009 FINDINGS: The lungs are clear without focal pneumonia, edema, pneumothorax or pleural effusion. The cardiopericardial silhouette is within normal limits for size. No acute bony abnormality. IMPRESSION: No  active cardiopulmonary disease. Electronically Signed   By: Camellia Candle M.D.   On: 12/21/2023 09:14     Procedures   Medications Ordered in the ED - No data to display                                  Medical Decision Making Patient reports that her friend fell on her ribs over the weekend.  Patient complains of pain in her right ribs  Amount and/or Complexity of Data Reviewed Radiology: ordered and independent interpretation performed.    Details: Chest x-ray shows no acute abnormality  Risk Prescription drug management. Risk Details: Patient may have a nondisplaced rib fracture.  Patient counseled on contusions of chest possibility of rib fracture.  She is given a prescription for pain medicine she is advised to return if any  problems.  Patient is discharged in stable condition        Final diagnoses:  Contusion, chest wall, right, initial encounter    ED Discharge Orders          Ordered    HYDROcodone -acetaminophen  (NORCO/VICODIN) 5-325 MG tablet  Every 6 hours PRN        12/21/23 0934           An After Visit Summary was printed and given to the patient.     Flint Sonny POUR, PA-C 12/21/23 1235    Dreama Longs, MD 12/21/23 2328

## 2023-12-23 ENCOUNTER — Telehealth: Payer: Self-pay | Admitting: Internal Medicine

## 2023-12-23 NOTE — Telephone Encounter (Signed)
 I am going on vacation the 12th. Was hoping to get my vyvanse  refilled before I go out of town. Was wanting to see if we could up the dosage slightly

## 2023-12-24 MED ORDER — LISDEXAMFETAMINE DIMESYLATE 30 MG PO CAPS: 30.0000 mg | ORAL_CAPSULE | Freq: Every day | ORAL | 0 refills | Status: DC

## 2023-12-24 NOTE — Telephone Encounter (Signed)
 Keep fu appt  Sent in inc dose 30 mg vyvanse   to pharmacy.

## 2023-12-24 NOTE — Telephone Encounter (Signed)
 Attempted to reach pt. Left a voicemail to call us back.

## 2023-12-25 NOTE — Telephone Encounter (Signed)
 Attempted to reach pt. Left a detail message Rx is sent and a reminder pt has upcoming visit.

## 2023-12-29 ENCOUNTER — Encounter: Payer: Self-pay | Admitting: Internal Medicine

## 2023-12-29 ENCOUNTER — Telehealth (INDEPENDENT_AMBULATORY_CARE_PROVIDER_SITE_OTHER): Admitting: Internal Medicine

## 2023-12-29 DIAGNOSIS — S299XXS Unspecified injury of thorax, sequela: Secondary | ICD-10-CM | POA: Diagnosis not present

## 2023-12-29 DIAGNOSIS — F909 Attention-deficit hyperactivity disorder, unspecified type: Secondary | ICD-10-CM

## 2023-12-29 DIAGNOSIS — Z79899 Other long term (current) drug therapy: Secondary | ICD-10-CM

## 2023-12-29 NOTE — Progress Notes (Signed)
 Virtual Visit via Video Note  I connected with Kathleen Hayes on 12/29/23 at  8:30 AM EDT by a video enabled telemedicine application and verified that I am speaking with the correct person using two identifiers. Location patient vacation Location provider:work office Persons participating in the virtual visit: patient, provider   Patient aware  of the limitations of evaluation and management by telemedicine and  availability of in person appointments. and agreed to proceed.   HPI: Kathleen Hayes presents for video visit   Fu med check  Recently began vyvanse   and inc to 30 mg per day and feels much better ;effective for focus and lack of rebound end of day sx ( as compared to adder all)   had fall injury and popped rib chest wall went to ED  July 7  neg obv fx but sig cw injury  given small amount of hydrocodone  not taking . Doing ok  Now on vacation  [place] a week.   No sog se of med sleep not effedted mood stable on citalopram  ROS: See pertinent positives and negatives per HPI. Omeprazole   daily for gerd controlled  Past Medical History:  Diagnosis Date   Concussion    HEAD TRAUMA, CLOSED 06/08/2007   Qualifier: Diagnosis of  By: Charlett MD, Apolinar POUR    History of chlamydia 12/28/2012   Shingles     Past Surgical History:  Procedure Laterality Date   ADENOIDECTOMY  age 30   MYRINGOTOMY  42 months   WISDOM TOOTH EXTRACTION      Family History  Problem Relation Age of Onset   Mitral valve prolapse Father    Hypertension Father    Cancer Maternal Grandfather        PANCREATIC   Cancer Paternal Grandfather        BRAIN TUMOR   Hypertension Paternal Grandfather     Social History   Tobacco Use   Smoking status: Some Days    Types: Cigarettes   Smokeless tobacco: Never   Tobacco comments:    Pt reports started smoking cigarette 6 months ago. 1 pk last her for a month.   Vaping Use   Vaping status: Never Used  Substance Use Topics   Alcohol use: Yes     Alcohol/week: 0.0 standard drinks of alcohol    Comment: Rare   Drug use: No      Current Outpatient Medications:    citalopram  (CELEXA ) 40 MG tablet, Take 1 tablet (40 mg total) by mouth daily., Disp: 90 tablet, Rfl: 0   lisdexamfetamine (VYVANSE ) 30 MG capsule, Take 1 capsule (30 mg total) by mouth daily. Dosage increase, Disp: 30 capsule, Rfl: 0   norgestimate -ethinyl estradiol  (SPRINTEC 28) 0.25-35 MG-MCG tablet, Take 1 tablet by mouth daily., Disp: 3 Package, Rfl: 4   omeprazole  (PRILOSEC) 20 MG capsule, Take 1 capsule (20 mg total) by mouth daily., Disp: 30 capsule, Rfl: 3   HYDROcodone -acetaminophen  (NORCO/VICODIN) 5-325 MG tablet, Take 1 tablet by mouth every 6 (six) hours as needed for moderate pain (pain score 4-6). (Patient not taking: Reported on 12/29/2023), Disp: 12 tablet, Rfl: 0   omeprazole  (PRILOSEC) 20 MG capsule, TAKE 1 CAPSULE BY MOUTH ONCE DAILY ., Disp: 90 capsule, Rfl: 0   ondansetron  (ZOFRAN ) 4 MG tablet, Take 1 tablet (4 mg total) by mouth every 8 (eight) hours as needed for nausea or vomiting., Disp: 20 tablet, Rfl: 0  EXAM: BP Readings from Last 3 Encounters:  12/21/23 (!) 122/57  04/15/23 118/80  05/04/18 (!) 98/50    VITALS per patient if applicable:  GENERAL: alert, oriented, appears well and in no acute distress  HEENT: atraumatic, conjunttiva clear, no obvious abnormalities on inspection of external nose and ears  NECK: normal movements of the head and neck  LUNGS: on inspection no signs of respiratory distress, breathing rate appears normal, no obvious gross SOB, gasping or wheezing  CV: no obvious cyanosis  MS: moves all visible extremities without noticeable abnormality  PSYCH/NEURO: pleasant and cooperative, no obvious depression or anxiety, speech and thought processing grossly intact Lab Results  Component Value Date   WBC 6.0 12/12/2022   HGB 13.5 12/12/2022   HCT 41.0 12/12/2022   PLT 294.0 12/12/2022   GLUCOSE 91 12/12/2022    CHOL 151 12/12/2022   TRIG 280.0 (H) 12/12/2022   HDL 52.80 12/12/2022   LDLDIRECT 81.0 12/12/2022   LDLCALC 47 01/12/2014   ALT 15 12/12/2022   AST 16 12/12/2022   NA 137 12/12/2022   K 4.1 12/12/2022   CL 103 12/12/2022   CREATININE 0.52 12/12/2022   BUN 10 12/12/2022   CO2 27 12/12/2022   TSH 1.23 12/12/2022   HGBA1C 5.4 12/12/2022    ASSESSMENT AND PLAN:  Discussed the following assessment and plan:    ICD-10-CM   1. Attention deficit hyperactivity disorder (ADHD), unspecified ADHD type  F90.9     2. Medication management  Z79.899     3. Injury of chest wall, sequela  S29.9XXS      Continue vyvanse   30 contact for refill after this month and will refill  Plan fu in 4-6 months unless need to alter medication  needs in person eventually    Expectant management. About cw injury  agree not taking narcotic at this time Counseled.   Expectant management and discussion of plan and treatment with opportunity to ask questions and all were answered. The patient agreed with the plan and demonstrated an understanding of the instructions.   Advised to call back or seek an in-person evaluation if worsening  or having  further concerns  in interim. Return for 4-6 months .    Apolinar Eastern, MD

## 2024-01-04 ENCOUNTER — Telehealth: Admitting: Physician Assistant

## 2024-01-04 DIAGNOSIS — J020 Streptococcal pharyngitis: Secondary | ICD-10-CM

## 2024-01-05 MED ORDER — AMOXICILLIN 500 MG PO TABS: 500.0000 mg | ORAL_TABLET | Freq: Two times a day (BID) | ORAL | 0 refills | Status: AC

## 2024-01-05 NOTE — Progress Notes (Signed)

## 2024-01-05 NOTE — Progress Notes (Signed)
 I have spent 5 minutes in review of e-visit questionnaire, review and updating patient chart, medical decision making and response to patient.   Piedad Climes, PA-C

## 2024-01-27 ENCOUNTER — Encounter: Payer: Self-pay | Admitting: Internal Medicine

## 2024-01-27 MED ORDER — LISDEXAMFETAMINE DIMESYLATE 30 MG PO CAPS: 30.0000 mg | ORAL_CAPSULE | Freq: Every day | ORAL | 0 refills | Status: DC

## 2024-01-31 ENCOUNTER — Other Ambulatory Visit: Payer: Self-pay | Admitting: Internal Medicine

## 2024-01-31 ENCOUNTER — Encounter: Payer: Self-pay | Admitting: Internal Medicine

## 2024-02-01 MED ORDER — CITALOPRAM HYDROBROMIDE 40 MG PO TABS: 40.0000 mg | ORAL_TABLET | Freq: Every day | ORAL | 0 refills | Status: DC

## 2024-02-02 MED ORDER — CITALOPRAM HYDROBROMIDE 40 MG PO TABS: 40.0000 mg | ORAL_TABLET | Freq: Every day | ORAL | 1 refills | Status: DC

## 2024-03-01 ENCOUNTER — Encounter: Payer: Self-pay | Admitting: Internal Medicine

## 2024-03-02 MED ORDER — LISDEXAMFETAMINE DIMESYLATE 30 MG PO CAPS: 30.0000 mg | ORAL_CAPSULE | Freq: Every day | ORAL | 0 refills | Status: DC

## 2024-03-18 ENCOUNTER — Telehealth: Admitting: Physician Assistant

## 2024-03-18 DIAGNOSIS — J069 Acute upper respiratory infection, unspecified: Secondary | ICD-10-CM | POA: Diagnosis not present

## 2024-03-18 MED ORDER — PROMETHAZINE-DM 6.25-15 MG/5ML PO SYRP: 5.0000 mL | ORAL_SOLUTION | Freq: Four times a day (QID) | ORAL | 0 refills | Status: AC | PRN

## 2024-03-18 MED ORDER — FLUTICASONE PROPIONATE 50 MCG/ACT NA SUSP: 2.0000 | Freq: Every day | NASAL | 0 refills | Status: AC

## 2024-03-18 NOTE — Progress Notes (Signed)
 We are sorry you are not feeling well.  Here is how we plan to help!  Based on what you have shared with me, it looks like you may have a viral upper respiratory infection.  Upper respiratory infections are caused by a large number of viruses; however, rhinovirus is the most common cause.   Symptoms vary from person to person, with common symptoms including sore throat, cough, and fatigue or lack of energy.  A low-grade fever of up to 100.4 may present, but is often uncommon.  Symptoms vary however, and are closely related to a person's age or underlying illnesses.  The most common symptoms associated with an upper respiratory infection are nasal discharge or congestion, cough, sneezing, headache and pressure in the ears and face.  These symptoms usually persist for about 3 to 10 days, but can last up to 2 weeks.  It is important to know that upper respiratory infections do not cause serious illness or complications in most cases.    Upper respiratory infections can be transmitted from person to person, with the most common method of transmission being a person's hands.  The virus is able to live on the skin and can infect other persons for up to 2 hours after direct contact.  Also, these can be transmitted when someone coughs or sneezes; thus, it is important to cover the mouth to reduce this risk.  To keep the spread of the illness at bay, good hand hygiene is very important.  This is an infection that is most likely caused by a virus. There are no specific treatments other than to help you with the symptoms until the infection runs its course.  We are sorry you are not feeling well.  Here is how we plan to help!   For nasal congestion, you may use an oral decongestants such as Mucinex D or if you have glaucoma or high blood pressure use plain Mucinex.  Saline nasal spray or nasal drops can help and can safely be used as often as needed for congestion.  For your congestion, I have prescribed Fluticasone   nasal spray one spray in each nostril twice a day  If you do not have a history of heart disease, hypertension, diabetes or thyroid disease, prostate/bladder issues or glaucoma, you may also use Sudafed to treat nasal congestion.  It is highly recommended that you consult with a pharmacist or your primary care physician to ensure this medication is safe for you to take.     If you have a cough, you may use cough suppressants such as Delsym and Robitussin.  If you have glaucoma or high blood pressure, you can also use Coricidin HBP.   For cough I have prescribed for you Promethazine DM cough syrup Take 5mL every 6 hours as needed for cough.   If you have a sore or scratchy throat, use a saltwater gargle-  to  teaspoon of salt dissolved in a 4-ounce to 8-ounce glass of warm water.  Gargle the solution for approximately 15-30 seconds and then spit.  It is important not to swallow the solution.  You can also use throat lozenges/cough drops and Chloraseptic spray to help with throat pain or discomfort.  Warm or cold liquids can also be helpful in relieving throat pain.  For headache, pain or general discomfort, you can use Ibuprofen  or Tylenol  as directed.   Some authorities believe that zinc sprays or the use of Echinacea may shorten the course of your symptoms.   HOME  CARE Only take medications as instructed by your medical team. Be sure to drink plenty of fluids. Water is fine as well as fruit juices, sodas and electrolyte beverages. You may want to stay away from caffeine or alcohol. If you are nauseated, try taking small sips of liquids. How do you know if you are getting enough fluid? Your urine should be a pale yellow or almost colorless. Get rest. Taking a steamy shower or using a humidifier may help nasal congestion and ease sore throat pain. You can place a towel over your head and breathe in the steam from hot water coming from a faucet. Using a saline nasal spray works much the same  way. Cough drops, hard candies and sore throat lozenges may ease your cough. Avoid close contacts especially the very young and the elderly Cover your mouth if you cough or sneeze Always remember to wash your hands.   GET HELP RIGHT AWAY IF: You develop worsening fever. If your symptoms do not improve within 10 days You develop yellow or green discharge from your nose over 3 days. You have coughing fits You develop a severe head ache or visual changes. You develop shortness of breath, difficulty breathing or start having chest pain Your symptoms persist after you have completed your treatment plan  MAKE SURE YOU  Understand these instructions. Will watch your condition. Will get help right away if you are not doing well or get worse.  Your e-visit answers were reviewed by a board certified advanced clinical practitioner to complete your personal care plan. Depending upon the condition, your plan could have included both over the counter or prescription medications. Please review your pharmacy choice. If there is a problem, you may call our nursing hot line at and have the prescription routed to another pharmacy. Your safety is important to us . If you have drug allergies check your prescription carefully.   You can use MyChart to ask questions about today's visit, request a non-urgent call back, or ask for a work or school excuse for 24 hours related to this e-Visit. If it has been greater than 24 hours you will need to follow up with your provider, or enter a new e-Visit to address those concerns. You will get an e-mail in the next two days asking about your experience.  I hope that your e-visit has been valuable and will speed your recovery. Thank you for using e-visits.   I have spent 5 minutes in review of e-visit questionnaire, review and updating patient chart, medical decision making and response to patient.   Delon CHRISTELLA Dickinson, PA-C

## 2024-03-31 ENCOUNTER — Encounter: Payer: Self-pay | Admitting: Internal Medicine

## 2024-03-31 DIAGNOSIS — K219 Gastro-esophageal reflux disease without esophagitis: Secondary | ICD-10-CM

## 2024-04-01 MED ORDER — LISDEXAMFETAMINE DIMESYLATE 30 MG PO CAPS: 30.0000 mg | ORAL_CAPSULE | Freq: Every day | ORAL | 0 refills | Status: DC

## 2024-04-06 ENCOUNTER — Other Ambulatory Visit: Payer: Self-pay | Admitting: Internal Medicine

## 2024-04-06 DIAGNOSIS — K219 Gastro-esophageal reflux disease without esophagitis: Secondary | ICD-10-CM

## 2024-04-07 MED ORDER — OMEPRAZOLE 20 MG PO CPDR: DELAYED_RELEASE_CAPSULE | ORAL | 0 refills | Status: DC

## 2024-04-07 NOTE — Addendum Note (Signed)
 Addended by: Mirian Casco on: 04/07/2024 10:49 AM   Modules accepted: Orders

## 2024-05-04 ENCOUNTER — Encounter: Payer: Self-pay | Admitting: Internal Medicine

## 2024-05-04 MED ORDER — LISDEXAMFETAMINE DIMESYLATE 30 MG PO CAPS: 30.0000 mg | ORAL_CAPSULE | Freq: Every day | ORAL | 0 refills | Status: DC

## 2024-05-04 NOTE — Telephone Encounter (Signed)
 Sent in vyvanse  30 to [facility] [address]

## 2024-05-04 NOTE — Telephone Encounter (Signed)
 Requesting: Vyvanse  30mg   Contract: None UDS: None Last Visit:12/29/23 Next Visit: None Last Refill: 04/01/24 #30 and 0RF   Please Advise

## 2024-06-09 ENCOUNTER — Encounter: Payer: Self-pay | Admitting: Internal Medicine

## 2024-06-13 ENCOUNTER — Other Ambulatory Visit: Payer: Self-pay | Admitting: Family

## 2024-06-13 MED ORDER — LISDEXAMFETAMINE DIMESYLATE 30 MG PO CAPS: 30.0000 mg | ORAL_CAPSULE | Freq: Every day | ORAL | 0 refills | Status: DC

## 2024-06-14 ENCOUNTER — Ambulatory Visit: Payer: Self-pay | Admitting: Internal Medicine

## 2024-06-21 ENCOUNTER — Telehealth (INDEPENDENT_AMBULATORY_CARE_PROVIDER_SITE_OTHER): Payer: Self-pay | Admitting: Internal Medicine

## 2024-06-21 ENCOUNTER — Other Ambulatory Visit: Payer: Self-pay | Admitting: Internal Medicine

## 2024-06-21 VITALS — HR 98 | Ht 64.2 in | Wt 165.0 lb

## 2024-06-21 DIAGNOSIS — F909 Attention-deficit hyperactivity disorder, unspecified type: Secondary | ICD-10-CM

## 2024-06-21 DIAGNOSIS — K219 Gastro-esophageal reflux disease without esophagitis: Secondary | ICD-10-CM

## 2024-06-21 DIAGNOSIS — E781 Pure hyperglyceridemia: Secondary | ICD-10-CM

## 2024-06-21 DIAGNOSIS — F411 Generalized anxiety disorder: Secondary | ICD-10-CM

## 2024-06-21 DIAGNOSIS — Z299 Encounter for prophylactic measures, unspecified: Secondary | ICD-10-CM

## 2024-06-21 DIAGNOSIS — Z79899 Other long term (current) drug therapy: Secondary | ICD-10-CM

## 2024-06-21 DIAGNOSIS — Z13220 Encounter for screening for lipoid disorders: Secondary | ICD-10-CM

## 2024-06-21 MED ORDER — CITALOPRAM HYDROBROMIDE 40 MG PO TABS: 40.0000 mg | ORAL_TABLET | Freq: Every day | ORAL | 1 refills | Status: AC

## 2024-06-21 MED ORDER — LISDEXAMFETAMINE DIMESYLATE 30 MG PO CAPS: 30.0000 mg | ORAL_CAPSULE | Freq: Every day | ORAL | 0 refills | Status: AC

## 2024-06-21 MED ORDER — OMEPRAZOLE 20 MG PO CPDR: DELAYED_RELEASE_CAPSULE | ORAL | 1 refills | Status: AC

## 2024-06-21 NOTE — Progress Notes (Signed)
 Future labs  med monitoring and prevention

## 2024-06-21 NOTE — Progress Notes (Signed)
 " Virtual Visit via Video Note  I connected with Kathleen Hayes on 06/21/2024 at  3:30 PM EST by a video enabled telemedicine application and verified that I am speaking with the correct person using two identifiers. Location patient: home Location provider:work office Persons participating in the virtual visit: patient, provider   Patient aware  of the limitations of evaluation and management by telemedicine and  availability of in person appointments. and agreed to proceed.   HPI: Kathleen Hayes presents for video visit  Reflux sx not every day once or twice a week  despite taking omeprazole  daily  No change in eating  bk fast  lunch  and gets sx mostly around 3-6 pm not nocturnal  Adhd  still helpful med but does wear off Anxiety wants to remain on same dose citalopram  as is helpful  Goes to gym  but has gaine some weight back  was down to 153 now 165     ROS: See pertinent positives and negatives per HPI. Sleep 7-9 hours  Past Medical History:  Diagnosis Date   Concussion    HEAD TRAUMA, CLOSED 06/08/2007   Qualifier: Diagnosis of  By: Charlett MD, Apolinar POUR    History of chlamydia 12/28/2012   Shingles     Past Surgical History:  Procedure Laterality Date   ADENOIDECTOMY  age 25   MYRINGOTOMY  23 months   WISDOM TOOTH EXTRACTION      Family History  Problem Relation Age of Onset   Mitral valve prolapse Father    Hypertension Father    Cancer Maternal Grandfather        PANCREATIC   Cancer Paternal Grandfather        BRAIN TUMOR   Hypertension Paternal Grandfather     Social History[1]   Current Medications[2]  EXAM: BP Readings from Last 3 Encounters:  12/21/23 (!) 122/57  04/15/23 118/80  05/04/18 (!) 98/50    VITALS per patient if applicable:  GENERAL: alert, oriented, appears well and in no acute distress  HEENT: atraumatic, conjunttiva clear, no obvious abnormalities on inspection of external nose and ears  NECK: normal movements of the head  and neck  LUNGS: on inspection no signs of respiratory distress, breathing rate appears normal, no obvious gross SOB, gasping or wheezing  CV: no obvious cyanosis  MS: moves all visible extremities without noticeable abnormality  PSYCH/NEURO: pleasant and cooperative, no obvious depression or anxiety, speech and thought processing grossly intact Lab Results  Component Value Date   WBC 6.0 12/12/2022   HGB 13.5 12/12/2022   HCT 41.0 12/12/2022   PLT 294.0 12/12/2022   GLUCOSE 91 12/12/2022   CHOL 151 12/12/2022   TRIG 280.0 (H) 12/12/2022   HDL 52.80 12/12/2022   LDLDIRECT 81.0 12/12/2022   LDLCALC 47 01/12/2014   ALT 15 12/12/2022   AST 16 12/12/2022   NA 137 12/12/2022   K 4.1 12/12/2022   CL 103 12/12/2022   CREATININE 0.52 12/12/2022   BUN 10 12/12/2022   CO2 27 12/12/2022   TSH 1.23 12/12/2022   HGBA1C 5.4 12/12/2022    ASSESSMENT AND PLAN:  Discussed the following assessment and plan:    ICD-10-CM   1. Attention deficit hyperactivity disorder (ADHD), unspecified ADHD type  F90.9     2. Gastroesophageal reflux disease without esophagitis  K21.9 omeprazole  (PRILOSEC) 20 MG capsule    3. Anxiety state  F41.1     4. Medication management  Z79.899      Remain on  citalopram  she feels haelping a lot and reluctant to dec at this time  Add/adhd vvyanse helpf although less effect at end of day   Benefit more than risk of medications  to continue.  Gerd  sx off and on despite reg dose ppi   advised take 30 - 60 min pre meal   to be more effective   Awareness of food time of day  and  a few  pound weight loss may help also .  Plan fasting lab due   orders placed .  Fu in person  preferred in about 6 months or as needed   Counseled.   Expectant management and discussion of plan and treatment with opportunity to ask questions and all were answered. The patient agreed with the plan and demonstrated an understanding of the instructions.   Advised to call back or  seek an in-person evaluation if worsening  or having  further concerns  in interim. Return in about 6 months (around 12/19/2024) for in person .   Apolinar Eastern, MD     [1]  Social History Tobacco Use   Smoking status: Some Days    Types: Cigarettes   Smokeless tobacco: Never   Tobacco comments:    Pt reports started smoking cigarette 6 months ago. 1 pk last her for a month.   Vaping Use   Vaping status: Never Used  Substance Use Topics   Alcohol use: Yes    Alcohol/week: 0.0 standard drinks of alcohol    Comment: Rare   Drug use: No  [2]  Current Outpatient Medications:    fluticasone  (FLONASE ) 50 MCG/ACT nasal spray, Place 2 sprays into both nostrils daily., Disp: 16 g, Rfl: 0   norgestimate -ethinyl estradiol  (SPRINTEC 28) 0.25-35 MG-MCG tablet, Take 1 tablet by mouth daily., Disp: 3 Package, Rfl: 4   citalopram  (CELEXA ) 40 MG tablet, Take 1 tablet (40 mg total) by mouth daily., Disp: 90 tablet, Rfl: 1   lisdexamfetamine  (VYVANSE ) 30 MG capsule, Take 1 capsule (30 mg total) by mouth daily., Disp: 30 capsule, Rfl: 0   omeprazole  (PRILOSEC) 20 MG capsule, TAKE 1 CAPSULE BY MOUTH ONCE DAILY .30-60 minutes before a meal, Disp: 90 capsule, Rfl: 1   promethazine -dextromethorphan (PROMETHAZINE -DM) 6.25-15 MG/5ML syrup, Take 5 mLs by mouth 4 (four) times daily as needed. (Patient not taking: Reported on 06/21/2024), Disp: 118 mL, Rfl: 0  "
